# Patient Record
Sex: Male | Born: 1965 | Race: White | Hispanic: No | Marital: Married | Smoking: Former smoker
Health system: Southern US, Community
[De-identification: ages and names within clinical notes are randomized; demographics above are authoritative.]

## PROBLEM LIST (undated history)

## (undated) DIAGNOSIS — F419 Anxiety disorder, unspecified: Secondary | ICD-10-CM

## (undated) DIAGNOSIS — K449 Diaphragmatic hernia without obstruction or gangrene: Secondary | ICD-10-CM

## (undated) DIAGNOSIS — K219 Gastro-esophageal reflux disease without esophagitis: Secondary | ICD-10-CM

## (undated) DIAGNOSIS — T7840XA Allergy, unspecified, initial encounter: Secondary | ICD-10-CM

## (undated) DIAGNOSIS — E785 Hyperlipidemia, unspecified: Secondary | ICD-10-CM

## (undated) HISTORY — DX: Diaphragmatic hernia without obstruction or gangrene: K44.9

## (undated) HISTORY — DX: Allergy, unspecified, initial encounter: T78.40XA

## (undated) HISTORY — DX: Gastro-esophageal reflux disease without esophagitis: K21.9

## (undated) HISTORY — DX: Anxiety disorder, unspecified: F41.9

## (undated) HISTORY — PX: TONSILLECTOMY: SUR1361

## (undated) HISTORY — DX: Hyperlipidemia, unspecified: E78.5

---

## 2000-05-07 ENCOUNTER — Encounter: Payer: Self-pay | Admitting: Orthopaedic Surgery

## 2000-05-07 ENCOUNTER — Encounter: Admission: RE | Admit: 2000-05-07 | Discharge: 2000-05-07 | Payer: Self-pay | Admitting: Orthopaedic Surgery

## 2004-09-11 ENCOUNTER — Ambulatory Visit: Payer: Self-pay | Admitting: Family Medicine

## 2006-08-28 ENCOUNTER — Ambulatory Visit: Payer: Self-pay | Admitting: Family Medicine

## 2006-08-29 ENCOUNTER — Ambulatory Visit: Payer: Self-pay | Admitting: Family Medicine

## 2006-08-29 LAB — CONVERTED CEMR LAB
ALT: 26 units/L (ref 0–40)
AST: 25 units/L (ref 0–37)
Albumin: 4 g/dL (ref 3.5–5.2)
Alkaline Phosphatase: 44 units/L (ref 39–117)
BUN: 13 mg/dL (ref 6–23)
Basophils Absolute: 0 10*3/uL (ref 0.0–0.1)
Basophils Relative: 0.5 % (ref 0.0–1.0)
Bilirubin, Direct: 0.1 mg/dL (ref 0.0–0.3)
CO2: 32 meq/L (ref 19–32)
Calcium: 9.2 mg/dL (ref 8.4–10.5)
Chloride: 104 meq/L (ref 96–112)
Cholesterol: 245 mg/dL (ref 0–200)
Creatinine, Ser: 1.2 mg/dL (ref 0.4–1.5)
Direct LDL: 173.8 mg/dL
Eosinophils Absolute: 0.1 10*3/uL (ref 0.0–0.6)
Eosinophils Relative: 1.5 % (ref 0.0–5.0)
GFR calc Af Amer: 85 mL/min
GFR calc non Af Amer: 71 mL/min
Glucose, Bld: 91 mg/dL (ref 70–99)
HCT: 45.3 % (ref 39.0–52.0)
HDL: 44.4 mg/dL (ref >39.0)
Hemoglobin: 15.2 g/dL (ref 13.0–17.0)
Lymphocytes Relative: 22 % (ref 12.0–46.0)
MCHC: 33.5 g/dL (ref 30.0–36.0)
MCV: 89.5 fL (ref 78.0–100.0)
Monocytes Absolute: 0.6 10*3/uL (ref 0.2–0.7)
Monocytes Relative: 8.6 % (ref 3.0–11.0)
Neutro Abs: 4.8 10*3/uL (ref 1.4–7.7)
Neutrophils Relative %: 67.4 % (ref 43.0–77.0)
Platelets: 249 10*3/uL (ref 150–400)
Potassium: 4.4 meq/L (ref 3.5–5.1)
RBC: 5.06 M/uL (ref 4.22–5.81)
RDW: 12.2 % (ref 11.5–14.6)
Sodium: 141 meq/L (ref 135–145)
TSH: 1.24 microintl units/mL (ref 0.35–5.50)
Total Bilirubin: 0.9 mg/dL (ref 0.3–1.2)
Total CHOL/HDL Ratio: 5.5
Total Protein: 6.5 g/dL (ref 6.0–8.3)
Triglycerides: 148 mg/dL (ref 0–149)
VLDL: 30 mg/dL (ref 0–40)
WBC: 7.1 10*3/uL (ref 4.5–10.5)

## 2006-09-05 ENCOUNTER — Ambulatory Visit: Payer: Self-pay | Admitting: Family Medicine

## 2006-11-06 ENCOUNTER — Ambulatory Visit: Payer: Self-pay | Admitting: Family Medicine

## 2006-11-06 LAB — CONVERTED CEMR LAB
Cholesterol: 178 mg/dL (ref 0–200)
HDL: 41.3 mg/dL (ref >39.0)
LDL Cholesterol: 120 mg/dL — ABNORMAL HIGH (ref 0–99)
Total CHOL/HDL Ratio: 4.3
Triglycerides: 85 mg/dL (ref 0–149)
VLDL: 17 mg/dL (ref 0–40)

## 2007-02-24 ENCOUNTER — Telehealth: Payer: Self-pay | Admitting: Family Medicine

## 2007-04-13 DIAGNOSIS — E785 Hyperlipidemia, unspecified: Secondary | ICD-10-CM | POA: Insufficient documentation

## 2007-09-25 ENCOUNTER — Telehealth: Payer: Self-pay | Admitting: Family Medicine

## 2007-09-28 ENCOUNTER — Ambulatory Visit: Payer: Self-pay | Admitting: Family Medicine

## 2007-09-28 DIAGNOSIS — M654 Radial styloid tenosynovitis [de Quervain]: Secondary | ICD-10-CM | POA: Insufficient documentation

## 2007-10-07 DIAGNOSIS — K625 Hemorrhage of anus and rectum: Secondary | ICD-10-CM | POA: Insufficient documentation

## 2007-10-14 ENCOUNTER — Telehealth: Payer: Self-pay | Admitting: Family Medicine

## 2007-10-15 ENCOUNTER — Ambulatory Visit: Payer: Self-pay | Admitting: Family Medicine

## 2007-10-15 LAB — CONVERTED CEMR LAB
ALT: 29 units/L (ref 0–53)
AST: 29 units/L (ref 0–37)
Albumin: 4.3 g/dL (ref 3.5–5.2)
Alkaline Phosphatase: 51 units/L (ref 39–117)
BUN: 12 mg/dL (ref 6–23)
Basophils Absolute: 0 10*3/uL (ref 0.0–0.1)
Basophils Relative: 0 % (ref 0.0–1.0)
Bilirubin Urine: NEGATIVE
Bilirubin, Direct: 0.1 mg/dL (ref 0.0–0.3)
Blood in Urine, dipstick: NEGATIVE
CO2: 30 meq/L (ref 19–32)
Calcium: 9.3 mg/dL (ref 8.4–10.5)
Chloride: 104 meq/L (ref 96–112)
Cholesterol: 228 mg/dL (ref 0–200)
Creatinine, Ser: 1.2 mg/dL (ref 0.4–1.5)
Direct LDL: 173.8 mg/dL
Eosinophils Absolute: 0.1 10*3/uL (ref 0.0–0.7)
Eosinophils Relative: 1.9 % (ref 0.0–5.0)
GFR calc Af Amer: 85 mL/min
GFR calc non Af Amer: 70 mL/min
Glucose, Bld: 76 mg/dL (ref 70–99)
Glucose, Urine, Semiquant: NEGATIVE
HCT: 46 % (ref 39.0–52.0)
HDL: 39.4 mg/dL (ref >39.0)
Hemoglobin: 15.4 g/dL (ref 13.0–17.0)
Ketones, urine, test strip: NEGATIVE
Lymphocytes Relative: 24.3 % (ref 12.0–46.0)
MCHC: 33.4 g/dL (ref 30.0–36.0)
MCV: 89.3 fL (ref 78.0–100.0)
Monocytes Absolute: 0.6 10*3/uL (ref 0.1–1.0)
Monocytes Relative: 9.8 % (ref 3.0–12.0)
Neutro Abs: 4 10*3/uL (ref 1.4–7.7)
Neutrophils Relative %: 64 % (ref 43.0–77.0)
Nitrite: NEGATIVE
Platelets: 225 10*3/uL (ref 150–400)
Potassium: 4.6 meq/L (ref 3.5–5.1)
RBC: 5.16 M/uL (ref 4.22–5.81)
RDW: 12.3 % (ref 11.5–14.6)
Sodium: 139 meq/L (ref 135–145)
Specific Gravity, Urine: 1.025
TSH: 1.04 microintl units/mL (ref 0.35–5.50)
Total Bilirubin: 0.9 mg/dL (ref 0.3–1.2)
Total CHOL/HDL Ratio: 5.8
Total Protein: 6.5 g/dL (ref 6.0–8.3)
Triglycerides: 152 mg/dL — ABNORMAL HIGH (ref 0–149)
Urobilinogen, UA: 0.2
VLDL: 30 mg/dL (ref 0–40)
WBC Urine, dipstick: NEGATIVE
WBC: 6.2 10*3/uL (ref 4.5–10.5)
pH: 5.5

## 2007-10-28 ENCOUNTER — Ambulatory Visit: Payer: Self-pay | Admitting: Family Medicine

## 2008-04-19 ENCOUNTER — Telehealth: Payer: Self-pay | Admitting: Family Medicine

## 2009-02-10 ENCOUNTER — Ambulatory Visit: Payer: Self-pay | Admitting: Family Medicine

## 2009-02-10 DIAGNOSIS — F410 Panic disorder [episodic paroxysmal anxiety] without agoraphobia: Secondary | ICD-10-CM | POA: Insufficient documentation

## 2009-02-10 LAB — CONVERTED CEMR LAB
ALT: 36 units/L (ref 0–53)
AST: 31 units/L (ref 0–37)
Albumin: 4.2 g/dL (ref 3.5–5.2)
Alkaline Phosphatase: 57 units/L (ref 39–117)
BUN: 13 mg/dL (ref 6–23)
Basophils Absolute: 0 10*3/uL (ref 0.0–0.1)
Basophils Relative: 0.1 % (ref 0.0–3.0)
Bilirubin Urine: NEGATIVE
Bilirubin, Direct: 0.1 mg/dL (ref 0.0–0.3)
Blood in Urine, dipstick: NEGATIVE
CO2: 32 meq/L (ref 19–32)
Calcium: 9.1 mg/dL (ref 8.4–10.5)
Chloride: 107 meq/L (ref 96–112)
Cholesterol: 213 mg/dL — ABNORMAL HIGH (ref 0–200)
Creatinine, Ser: 1.1 mg/dL (ref 0.4–1.5)
Direct LDL: 156.9 mg/dL
Eosinophils Absolute: 0.1 10*3/uL (ref 0.0–0.7)
Eosinophils Relative: 1.3 % (ref 0.0–5.0)
GFR calc non Af Amer: 76.98 mL/min (ref >60)
Glucose, Bld: 86 mg/dL (ref 70–99)
Glucose, Urine, Semiquant: NEGATIVE
HCT: 43.8 % (ref 39.0–52.0)
HDL: 45.4 mg/dL (ref >39.00)
Hemoglobin: 14.9 g/dL (ref 13.0–17.0)
Ketones, urine, test strip: NEGATIVE
Lymphocytes Relative: 17.8 % (ref 12.0–46.0)
Lymphs Abs: 1.2 10*3/uL (ref 0.7–4.0)
MCHC: 34.1 g/dL (ref 30.0–36.0)
MCV: 92 fL (ref 78.0–100.0)
Monocytes Absolute: 0.6 10*3/uL (ref 0.1–1.0)
Monocytes Relative: 8.8 % (ref 3.0–12.0)
Neutro Abs: 5 10*3/uL (ref 1.4–7.7)
Neutrophils Relative %: 72 % (ref 43.0–77.0)
Nitrite: NEGATIVE
Platelets: 194 10*3/uL (ref 150.0–400.0)
Potassium: 4.3 meq/L (ref 3.5–5.1)
Protein, U semiquant: NEGATIVE
RBC: 4.76 M/uL (ref 4.22–5.81)
RDW: 12.3 % (ref 11.5–14.6)
Sodium: 142 meq/L (ref 135–145)
Specific Gravity, Urine: 1.02
TSH: 0.92 microintl units/mL (ref 0.35–5.50)
Total Bilirubin: 0.9 mg/dL (ref 0.3–1.2)
Total CHOL/HDL Ratio: 5
Total Protein: 6.7 g/dL (ref 6.0–8.3)
Triglycerides: 87 mg/dL (ref 0.0–149.0)
Urobilinogen, UA: 0.2
VLDL: 17.4 mg/dL (ref 0.0–40.0)
WBC Urine, dipstick: NEGATIVE
WBC: 6.9 10*3/uL (ref 4.5–10.5)
pH: 6

## 2009-03-01 ENCOUNTER — Ambulatory Visit: Payer: Self-pay | Admitting: Family Medicine

## 2010-05-01 ENCOUNTER — Ambulatory Visit: Payer: Self-pay | Admitting: Family Medicine

## 2010-05-01 DIAGNOSIS — K219 Gastro-esophageal reflux disease without esophagitis: Secondary | ICD-10-CM | POA: Insufficient documentation

## 2010-05-01 LAB — CONVERTED CEMR LAB
ALT: 32 units/L (ref 0–53)
AST: 30 units/L (ref 0–37)
Albumin: 4.1 g/dL (ref 3.5–5.2)
Alkaline Phosphatase: 57 units/L (ref 39–117)
BUN: 13 mg/dL (ref 6–23)
Basophils Absolute: 0 10*3/uL (ref 0.0–0.1)
Basophils Relative: 0.4 % (ref 0.0–3.0)
Bilirubin Urine: NEGATIVE
Bilirubin, Direct: 0.1 mg/dL (ref 0.0–0.3)
Blood in Urine, dipstick: NEGATIVE
CO2: 30 meq/L (ref 19–32)
Calcium: 9.1 mg/dL (ref 8.4–10.5)
Chloride: 103 meq/L (ref 96–112)
Cholesterol: 206 mg/dL — ABNORMAL HIGH (ref 0–200)
Creatinine, Ser: 1.1 mg/dL (ref 0.4–1.5)
Direct LDL: 141.1 mg/dL
Eosinophils Absolute: 0.1 10*3/uL (ref 0.0–0.7)
Eosinophils Relative: 1.6 % (ref 0.0–5.0)
GFR calc non Af Amer: 77.37 mL/min (ref >60)
Glucose, Bld: 82 mg/dL (ref 70–99)
Glucose, Urine, Semiquant: NEGATIVE
HCT: 42.7 % (ref 39.0–52.0)
HDL: 41 mg/dL (ref >39.00)
Hemoglobin: 14.7 g/dL (ref 13.0–17.0)
Ketones, urine, test strip: NEGATIVE
Lymphocytes Relative: 23 % (ref 12.0–46.0)
Lymphs Abs: 1.4 10*3/uL (ref 0.7–4.0)
MCHC: 34.5 g/dL (ref 30.0–36.0)
MCV: 91.3 fL (ref 78.0–100.0)
Monocytes Absolute: 0.5 10*3/uL (ref 0.1–1.0)
Monocytes Relative: 8.1 % (ref 3.0–12.0)
Neutro Abs: 4.1 10*3/uL (ref 1.4–7.7)
Neutrophils Relative %: 66.9 % (ref 43.0–77.0)
Nitrite: NEGATIVE
Platelets: 233 10*3/uL (ref 150.0–400.0)
Potassium: 4.4 meq/L (ref 3.5–5.1)
Protein, U semiquant: NEGATIVE
RBC: 4.68 M/uL (ref 4.22–5.81)
RDW: 13.5 % (ref 11.5–14.6)
Sodium: 138 meq/L (ref 135–145)
Specific Gravity, Urine: 1.01
TSH: 0.69 microintl units/mL (ref 0.35–5.50)
Total Bilirubin: 0.5 mg/dL (ref 0.3–1.2)
Total CHOL/HDL Ratio: 5
Total Protein: 6.5 g/dL (ref 6.0–8.3)
Triglycerides: 162 mg/dL — ABNORMAL HIGH (ref 0.0–149.0)
Urobilinogen, UA: 0.2
VLDL: 32.4 mg/dL (ref 0.0–40.0)
WBC Urine, dipstick: NEGATIVE
WBC: 6.2 10*3/uL (ref 4.5–10.5)
pH: 7

## 2010-05-11 ENCOUNTER — Encounter: Payer: Self-pay | Admitting: Family Medicine

## 2010-05-11 ENCOUNTER — Ambulatory Visit: Payer: Self-pay | Admitting: Family Medicine

## 2010-08-07 NOTE — Assessment & Plan Note (Signed)
Summary: cpx/njr   Vital Signs:  Patient profile:   45 year old male Height:      70 inches Weight:      191 pounds Temp:     97.4 degrees F oral BP sitting:   110 / 80  (left arm) Cuff size:   regular  Vitals Entered By: Kern Reap CMA Duncan Dull) (May 11, 2010 11:21 AM) CC: cpx   CC:  cpx.  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  Panic attacks hyperlipidemia.  He saw his been in excellent health other than above.  He said no chronic health problems.  He takes over-the-counter Tums for his reflux.  Review of systems negative.  Tetanus booster 2010, seasonal flu shot 2011,  Personal habits....... he continues to smoke about a pack of cigarettes per week does a lot of minutes and drinks a lot of caffeine.  Advised him because of the reflux.  He should stop smoking completely minimizes caffeine and stop the peppermint  Preventive Screening-Counseling & Management  Alcohol-Tobacco     Smoking Cessation Counseling: yes  Allergies (verified): No Known Drug Allergies  Past History:  Past medical, surgical, family and social histories (including risk factors) reviewed, and no changes noted (except as noted below).  Past Medical History: Reviewed history from 04/13/2007 and no changes required. Hyperlipidemia  Past Surgical History: Reviewed history from 04/13/2007 and no changes required. Tonsillectomy  Family History: Reviewed history from 04/13/2007 and no changes required. Family History of Arthritis Family History Hypertension Family History Thyroid disease Family History Uterine cancer Family History of Cardiovascular disorder Family History of Respiratory disease  Social History: Reviewed history from 10/28/2007 and no changes required. 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 No apparent alopecia or balding. Eyes:  No corneal or conjunctival inflammation noted. EOMI. Perrla. Funduscopic exam benign, without hemorrhages, exudates or papilledema. Vision grossly normal. Ears:  External ear exam shows no significant lesions or deformities.  Otoscopic examination reveals clear canals, tympanic membranes are intact bilaterally without bulging, retraction, inflammation or discharge. Hearing is grossly normal bilaterally. Nose:  External nasal examination shows no deformity or inflammation. Nasal mucosa are pink and moist without lesions or exudates. Mouth:  Oral mucosa and oropharynx without lesions or exudates.  Teeth in good repair. Neck:  No deformities, masses, or tenderness noted. Chest Wall:  No deformities, masses, tenderness or gynecomastia noted. Breasts:  No masses or gynecomastia noted Lungs:  Normal respiratory effort, chest expands symmetrically. Lungs are clear to auscultation, no crackles or wheezes. Heart:  Normal rate and regular rhythm. S1 and S2 normal without gallop, murmur, click, rub or other extra sounds. Abdomen:  Bowel sounds positive,abdomen soft and non-tender without masses, organomegaly or hernias noted. Rectal:  No external abnormalities noted. Normal sphincter tone. No rectal masses or tenderness. Genitalia:  Testes bilaterally descended without nodularity, tenderness or masses. No scrotal masses or lesions. No penis lesions or urethral discharge. Prostate:  Prostate gland firm and smooth, no enlargement, nodularity, tenderness, mass, asymmetry or induration. Msk:  No deformity or scoliosis noted of thoracic or lumbar spine.   Pulses:  R and L  carotid,radial,femoral,dorsalis pedis  and posterior tibial pulses are full and equal bilaterally Extremities:  No clubbing, cyanosis, edema, or deformity noted with normal full range of motion of all joints.   Neurologic:  No cranial nerve deficits noted. Station and gait are normal. Plantar reflexes are down-going bilaterally. DTRs are symmetrical throughout. Sensory, motor and coordinative functions appear intact. Skin:  Intact without suspicious lesions or rashes Cervical Nodes:  No lymphadenopathy noted Axillary Nodes:  No palpable lymphadenopathy Inguinal Nodes:  No significant adenopathy Psych:  Cognition and judgment appear intact. Alert and cooperative with normal attention span and concentration. No apparent delusions, illusions, hallucinations   Impression & Recommendations:  Problem # 1:  GERD (ICD-530.81) Assessment Improved  Orders: Prescription Created Electronically [phone])  Problem # 2:  PANIC ATTACK (ICD-300.01) Assessment: Improved  His updated medication list for this problem includes:    Celexa 20 Mg Tabs (Citalopram hydrobromide) .Marland Kitchen... Take one tablet daily  Orders: Prescription Created Electronically [phone])  Problem # 3:  PHYSICAL EXAMINATION (ICD-V70.0) Assessment: Unchanged  Orders: Prescription Created Electronically [phone]) EKG w/ Interpretation (93000)  Problem # 4:  HYPERLIPIDEMIA (ICD-272.4) Assessment: Improved  His updated medication list for this problem includes:    Zetia 10 Mg Tabs (Ezetimibe) .Marland Kitchen... 1 by mouth once daily  Orders: Prescription Created Electronically [phone]) EKG w/ Interpretation (93000)  Complete Medication List: 1)  Zetia 10 Mg Tabs (Ezetimibe) .Marland Kitchen.. 1 by mouth once daily 2)  Adult Aspirin Low Strength 81 Mg Tbdp (Aspirin) 3)  Celexa 20 Mg Tabs (Citalopram hydrobromide) .... Take one tablet daily 4)  Valtrex 1 Gm Tabs (Valacyclovir hcl) .... Take 1 tablet by mouth two times a day 5)  Anusol-hc 2.5 % Crea  (Hydrocortisone) .... Apply at bedtime  Patient Instructions: 1)  Please schedule a follow-up appointment in 1 year. 2)  It is important that you exercise regularly at least 20 minutes 5 times a week. If you develop chest pain, have severe difficulty breathing, or feel very tired , stop exercising immediately and seek medical attention. 3)  I would recommend he take Prilosec OTC 20 mg daily in the morning when you have a flare of the reflux 4)  Tobacco is very bad for your health and your loved ones! You Should stop smoking!.   Orders Added: 1)  Prescription Created Electronically [G8553] 2)  Est. Patient 40-64 years [99396] 3)  EKG w/ Interpretation [93000]   Immunization History:  Influenza Immunization History:    Influenza:  historical (04/07/2010)   Immunization History:  Influenza Immunization History:    Influenza:  Historical (04/07/2010)

## 2010-08-07 NOTE — Assessment & Plan Note (Signed)
Summary: MED CHECK//CCM   Vital Signs:  Patient profile:   45 year old male Height:      69 inches Weight:      191 pounds BMI:     28.31 Temp:     97.8 degrees F oral BP sitting:   120 / 80  (left arm) Cuff size:   regular  Vitals Entered By: Kern Reap CMA Duncan Dull) (May 01, 2010 10:52 AM) CC: follow-up visit   CC:  follow-up visit.  History of Present Illness: Gary [name] is a 45 year old male, smoker, 5 to 6 cigarettes a day, and in the [place], [employer], who comes in today for evaluation of reflux esophagitis and begin his medications renewed, since he's not been here in over a year.  He states for the past year and a half.  He said symptoms of reflux esophagitis, which he describes as mid epigastric burning.  It occurs once or twice a day.  Does not wake him up at night.  He has no difficulty swallowing.  He does consume 5 to 6 cigarettes a day, a lot of caffeine, chews gum on a regular basis, however, does not consume any alcohol.  She would also like to decrease his Celexa.  He takes Celexa 10 mg nightly because of a history of panic attacks.  We discussed the way to taper.  It one time he stopped this for 3 days in a row and began to have panic attacks.  Again.  Allergies: No Known Drug Allergies  Social History: Reviewed history from 10/28/2007 and no changes required. 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 Alcohol use-no Regular exercise-yes  Review of Systems      See HPI  Physical Exam  General:  Well-developed,well-nourished,in no acute distress; alert,appropriate and cooperative throughout examination   Problems:  Medical Problems Added: 1)  Dx of Gerd  (ICD-530.81)  Impression & Recommendations:  Problem # 1:  GERD (ICD-530.81) Assessment New  Orders: Venipuncture (84696) TLB-Lipid Panel (80061-LIPID) TLB-BMP (Basic Metabolic Panel-BMET) (80048-METABOL) TLB-CBC Platelet - w/Differential  (85025-CBCD) TLB-Hepatic/Liver Function Pnl (80076-HEPATIC) TLB-TSH (Thyroid Stimulating Hormone) (84443-TSH) Prescription Created Electronically (E9528) UA Dipstick w/o Micro (automated)  (81003) Specimen Handling (41324)  Problem # 2:  PANIC ATTACK (ICD-300.01) Assessment: Improved  His updated medication list for this problem includes:    Celexa 20 Mg Tabs (Citalopram hydrobromide) .Marland Kitchen... Take one tablet daily  Orders: Venipuncture (40102) TLB-Lipid Panel (80061-LIPID) TLB-BMP (Basic Metabolic Panel-BMET) (80048-METABOL) TLB-CBC Platelet - w/Differential (85025-CBCD) TLB-Hepatic/Liver Function Pnl (80076-HEPATIC) TLB-TSH (Thyroid Stimulating Hormone) (72536-UYQ) Prescription Created Electronically [phone]) UA Dipstick w/o Micro (automated)  (81003) Specimen Handling (25956)  Complete Medication List: 1)  Zetia 10 Mg Tabs (Ezetimibe) .Marland Kitchen.. 1 by mouth once daily 2)  Adult Aspirin Low Strength 81 Mg Tbdp (Aspirin) 3)  Celexa 20 Mg Tabs (Citalopram hydrobromide) .... Take one tablet daily 4)  Valtrex 1 Gm Tabs (Valacyclovir hcl) .... Take 1 tablet by mouth two times a day 5)  Anusol-hc 2.5 % Crea (Hydrocortisone) .... Apply at bedtime  Patient Instructions: 1)  take 10 mg of Celexa, Monday, Wednesday, Friday. 2)  Continue other medications. 3)  For the reflux esophagitis stop the nicotine, caffeine, peppermint, and begin Prilosec 20 mg twice daily. 4)  We will get all your blood work today. 5)  Return sometime in the next two weeks for a 30 minute appointment for general physical Prescriptions: VALTREX 1 GM  TABS (VALACYCLOVIR HCL) Take 1 tablet by mouth two times a day  #100 x 3  Entered and Authorized by:   Roderick Pee MD   Signed by:   Roderick Pee MD on 05/01/2010   Method used:   Electronically to        [facility]. [phone]* (retail)       [address]       [place], [place]  [address]       Ph: [phone]       Fax: [phone]    RxID:   [id number] CELEXA 20 MG  TABS (CITALOPRAM HYDROBROMIDE) take one tablet daily  #100 x 3   Entered and Authorized by:   Roderick Pee MD   Signed by:   Roderick Pee MD on 05/01/2010   Method used:   Electronically to        [facility]. [phone]* (retail)       [address]       [place], [place]  [address]       Ph: [phone]       Fax: [phone]   RxID:   [id number] ZETIA 10 MG TABS (EZETIMIBE) 1 by mouth once daily  #100 x 3   Entered and Authorized by:   Roderick Pee MD   Signed by:   Roderick Pee MD on 05/01/2010   Method used:   Electronically to        [facility] [phone]* (retail)       [address]       [place], [place]  [address]       Ph: [phone]       Fax: [phone]   RxID:   [id number]    Orders Added: 1)  Venipuncture [94854] 2)  TLB-Lipid Panel [80061-LIPID] 3)  TLB-BMP (Basic Metabolic Panel-BMET) [80048-METABOL] 4)  TLB-CBC Platelet - w/Differential [85025-CBCD] 5)  TLB-Hepatic/Liver Function Pnl [80076-HEPATIC] 6)  TLB-TSH (Thyroid Stimulating Hormone) [84443-TSH] 7)  Prescription Created Electronically [G8553] 8)  Est. Patient Level III [62703] 9)  UA Dipstick w/o Micro (automated)  [81003] 10)  Specimen Handling [99000]    Laboratory Results   Urine Tests  Date/Time Recieved: May 01, 2010 12:43 PM  Date/Time Reported: May 01, 2010 12:43 PM   Routine Urinalysis   Color: yellow Appearance: Clear Glucose: negative   (Normal Range: Negative) Bilirubin: negative   (Normal Range: Negative) Ketone: negative   (Normal Range: Negative) Spec. Gravity: 1.010   (Normal Range: 1.003-1.035) Blood: negative   (Normal Range: Negative) pH: 7.0   (Normal Range: 5.0-8.0) Protein: negative   (Normal Range: Negative) Urobilinogen: 0.2   (Normal Range: 0-1) Nitrite: negative   (Normal Range: Negative) Leukocyte Esterace: negative   (Normal Range:  Negative)    Comments: Wynona Canes, CMA  May 01, 2010 12:43 PM

## 2010-10-08 ENCOUNTER — Other Ambulatory Visit: Payer: Self-pay | Admitting: Dermatology

## 2010-11-22 ENCOUNTER — Other Ambulatory Visit: Payer: Self-pay | Admitting: Dermatology

## 2011-05-19 ENCOUNTER — Other Ambulatory Visit: Payer: Self-pay | Admitting: Family Medicine

## 2011-05-31 ENCOUNTER — Other Ambulatory Visit (INDEPENDENT_AMBULATORY_CARE_PROVIDER_SITE_OTHER): Payer: 59

## 2011-05-31 DIAGNOSIS — Z0000 Encounter for general adult medical examination without abnormal findings: Secondary | ICD-10-CM

## 2011-05-31 LAB — CBC WITH DIFFERENTIAL/PLATELET
Basophils Relative: 0.5 % (ref 0.0–3.0)
Eosinophils Absolute: 0.1 10*3/uL (ref 0.0–0.7)
Eosinophils Relative: 1.5 % (ref 0.0–5.0)
Lymphocytes Relative: 25.6 % (ref 12.0–46.0)
MCV: 89.6 fl (ref 78.0–100.0)
Monocytes Absolute: 0.5 10*3/uL (ref 0.1–1.0)
Neutrophils Relative %: 63.3 % (ref 43.0–77.0)
Platelets: 211 10*3/uL (ref 150.0–400.0)
RBC: 4.74 Mil/uL (ref 4.22–5.81)
WBC: 5.7 10*3/uL (ref 4.5–10.5)

## 2011-05-31 LAB — BASIC METABOLIC PANEL
BUN: 16 mg/dL (ref 6–23)
Calcium: 9 mg/dL (ref 8.4–10.5)
Creatinine, Ser: 1 mg/dL (ref 0.4–1.5)

## 2011-05-31 LAB — HEPATIC FUNCTION PANEL
ALT: 28 U/L (ref 0–53)
Alkaline Phosphatase: 49 U/L (ref 39–117)
Bilirubin, Direct: 0 mg/dL (ref 0.0–0.3)
Total Bilirubin: 0.7 mg/dL (ref 0.3–1.2)
Total Protein: 6.9 g/dL (ref 6.0–8.3)

## 2011-05-31 LAB — POCT URINALYSIS DIPSTICK
Blood, UA: NEGATIVE
Glucose, UA: NEGATIVE
Nitrite, UA: NEGATIVE
Protein, UA: NEGATIVE
Urobilinogen, UA: 0.2
pH, UA: 5.5

## 2011-05-31 LAB — LIPID PANEL
HDL: 49.5 mg/dL (ref >39.00)
Total CHOL/HDL Ratio: 4
Triglycerides: 97 mg/dL (ref 0.0–149.0)

## 2011-06-11 ENCOUNTER — Encounter: Payer: Self-pay | Admitting: Family Medicine

## 2011-06-11 ENCOUNTER — Ambulatory Visit (INDEPENDENT_AMBULATORY_CARE_PROVIDER_SITE_OTHER): Payer: 59 | Admitting: Family Medicine

## 2011-06-11 ENCOUNTER — Encounter: Payer: Self-pay | Admitting: Gastroenterology

## 2011-06-11 DIAGNOSIS — F410 Panic disorder [episodic paroxysmal anxiety] without agoraphobia: Secondary | ICD-10-CM

## 2011-06-11 DIAGNOSIS — K219 Gastro-esophageal reflux disease without esophagitis: Secondary | ICD-10-CM

## 2011-06-11 DIAGNOSIS — Z0000 Encounter for general adult medical examination without abnormal findings: Secondary | ICD-10-CM

## 2011-06-11 MED ORDER — CITALOPRAM HYDROBROMIDE 40 MG PO TABS: 40.0000 mg | ORAL_TABLET | Freq: Every day | ORAL | Status: DC

## 2011-06-11 MED ORDER — EZETIMIBE 10 MG PO TABS: 10.0000 mg | ORAL_TABLET | Freq: Every day | ORAL | Status: DC

## 2011-06-11 MED ORDER — VALACYCLOVIR HCL 1 G PO TABS: 1000.0000 mg | ORAL_TABLET | Freq: Two times a day (BID) | ORAL | Status: DC

## 2011-06-11 NOTE — Progress Notes (Signed)
  Subjective:    Patient ID: Gary [name], male    DOB: 12/16/1965, 45 y.o.   MRN: [id number]  HPI [name] is a 45 year old male ex smoker x 6 months.  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  We talked about going off the Celexa.  He takes Valtrex 1000 mg b.i.d. P.r.n. For outbreak of herpes.  He takes z 10 milligrams daily for hyperlipidemia.  He is intolerant of statins.  He sees his dermatologist twice yearly because his history of dysplastic nevi.   Review of Systems  Constitutional: Negative.   HENT: Negative.   Eyes: Negative.   Respiratory: Negative.   Cardiovascular: Negative.   Gastrointestinal: Negative.   Genitourinary: Negative.   Musculoskeletal: Negative.   Skin: Negative.   Neurological: Negative.   Hematological: Negative.   Psychiatric/Behavioral: Negative.        Objective:   Physical Exam  Constitutional: He is oriented to person, place, and time. He appears well-developed and well-nourished.  HENT:  Head: Normocephalic and atraumatic.  Right Ear: External ear normal.  Left Ear: External ear normal.  Nose: Nose normal.  Mouth/Throat: Oropharynx is clear and moist.  Eyes: Conjunctivae and EOM are normal. Pupils are equal, round, and reactive to light.  Neck: Normal range of motion. Neck supple. No JVD present. No tracheal deviation present. No thyromegaly present.  Cardiovascular: Normal rate, regular rhythm, normal heart sounds and intact distal pulses.  Exam reveals no gallop and no friction rub.   No murmur heard. Pulmonary/Chest: Effort normal and breath sounds normal. No stridor. No respiratory distress. He has no wheezes. He has no rales. He exhibits no tenderness.  Abdominal: Soft. Bowel sounds are normal. He exhibits no distension and no mass. There is no tenderness. There is no rebound and no guarding.  Genitourinary: Rectum normal,  prostate normal and penis normal. Guaiac negative stool. No penile tenderness.  Musculoskeletal: Normal range of motion. He exhibits no edema and no tenderness.  Lymphadenopathy:    He has no cervical adenopathy.  Neurological: He is alert and oriented to person, place, and time. He has normal reflexes. No cranial nerve deficit. He exhibits normal muscle tone.  Skin: Skin is warm and dry. No rash noted. No erythema. No pallor.  Psychiatric: He has a normal mood and affect. His behavior is normal. Judgment and thought content normal.          Assessment & Plan:  Healthy male.  History of panic attacks.  Plan Taper Celexa slowly.  History of HSV, Valtrex p.r.n.  History of hyperlipidemia.  Continue Zetia 10 mg daily

## 2011-06-11 NOTE — Patient Instructions (Signed)
Continue your current medications.  We will get you set up a consult and GI.  Return one year, sooner if any problems

## 2011-06-27 ENCOUNTER — Ambulatory Visit (INDEPENDENT_AMBULATORY_CARE_PROVIDER_SITE_OTHER): Payer: 59 | Admitting: Gastroenterology

## 2011-06-27 ENCOUNTER — Encounter: Payer: Self-pay | Admitting: Gastroenterology

## 2011-06-27 VITALS — BP 120/70 | HR 80 | Ht 69.0 in | Wt 194.0 lb

## 2011-06-27 DIAGNOSIS — F419 Anxiety disorder, unspecified: Secondary | ICD-10-CM | POA: Insufficient documentation

## 2011-06-27 DIAGNOSIS — F411 Generalized anxiety disorder: Secondary | ICD-10-CM

## 2011-06-27 DIAGNOSIS — R0789 Other chest pain: Secondary | ICD-10-CM

## 2011-06-27 DIAGNOSIS — K219 Gastro-esophageal reflux disease without esophagitis: Secondary | ICD-10-CM

## 2011-06-27 MED ORDER — PANTOPRAZOLE SODIUM 40 MG PO TBEC: 40.0000 mg | DELAYED_RELEASE_TABLET | Freq: Every day | ORAL | Status: DC

## 2011-06-27 NOTE — Patient Instructions (Addendum)
Stop your prilosec. Start Protonix take one tablet by mouth 30 min before before breakfast, rx sent to pharmacy.  Your procedure has been scheduled for 07/15/2011, please follow the seperate instructions.  Today you watched the movie on Gerd.  Diet for GERD or PUD Nutrition therapy can help ease the discomfort of gastroesophageal reflux disease (GERD) and peptic ulcer disease (PUD).  HOME CARE INSTRUCTIONS   Eat your meals slowly, in a relaxed setting.   Eat 5 to 6 small meals per day.   If a food causes distress, stop eating it for a period of time.  FOODS TO AVOID  Coffee, regular or decaffeinated.   Cola beverages, regular or low calorie.   Tea, regular or decaffeinated.   Pepper.   Cocoa.   High fat foods, including meats.   Butter, margarine, hydrogenated oil (trans fats).   Peppermint or spearmint (if you have GERD).   Fruits and vegetables if not tolerated.   Alcohol.   Nicotine (smoking or chewing). This is one of the most potent stimulants to acid production in the gastrointestinal tract.   Any food that seems to aggravate your condition.  If you have questions regarding your diet, ask your caregiver or a registered dietitian. TIPS  Lying flat may make symptoms worse. Keep the head of your bed raised 6 to 9 inches (15 to 23 cm) by using a foam wedge or blocks under the legs of the bed.   Do not lay down until 3 hours after eating a meal.   Daily physical activity may help reduce symptoms.  MAKE SURE YOU:   Understand these instructions.   Will watch your condition.   Will get help right away if you are not doing well or get worse.  Document Released: 06/24/2005 Document Revised: 03/06/2011 Document Reviewed: 11/07/2008 [facility] Patient Information 2012 [place], [place].

## 2011-06-27 NOTE — Progress Notes (Signed)
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 He has been on and off of PPI therapy for several years, and is currently on Prilosec 20 mg a day which he continues to take as needed and not regularly. Patient has not had previous upper GI series or endoscopy. There is no history of Raynaud's phenomenon or other symptoms of collagen vascular disease. He is followed by Dr. Kelle Darting in primary care, he has had no cardiopulmonary problems and normal laboratory testing. There is no history of hepatobiliary complaints, prior hepatitis or pancreatitis. Patient eats very erratically and not healthily. He smokes occasionally and uses  alcohol socially but denies NSAID abuse. There is no history of dysphagia. Patient also denies melena, hematochezia, Valerie regularity, lower abdominal pain, or specific food intolerances.  I have reviewed this patient's present history, medical and surgical past history, allergies and medications.     ROS: The remainder of the 10 point ROS is negative... no chest pain with exertion, palpitations, shortness of breath, cough, sputum production, or other cardiopulmonary symptomatology. He does have a chronic anxiety syndrome and takes Celexa 40 mg a day, also aspirin 81 mg a day.     Physical Exam: General well developed well nourished patient in no acute distress, appearing his stated age. I cannot appreciate stigmata of chronic liver disease. Eyes PERRLA, no icterus, fundoscopic exam per opthamologist Skin no lesions noted Neck supple, no adenopathy, no thyroid enlargement, no tenderness Chest clear to percussion and auscultation Heart no significant murmurs, gallops or rubs noted Abdomen no hepatosplenomegaly masses or tenderness, BS normal.  Extremities no acute  joint lesions, edema, phlebitis or evidence of cellulitis. Neurologic patient oriented x 3, cranial nerves intact, no focal neurologic deficits noted. Psychological mental status normal and normal affect.  Assessment and plan: This patient has chronic GERD with continued symptomatology because of noncompliance with his medications. His pain is most likely from a prolapsing hiatal hernia. I cannot appreciate hepatosplenomegaly or other abdominal masses. I have asked him to try Dexilant 60 mg before the first meal of the day, and we have scheduled endoscopic exam to exclude Barrett's mucosa and H. pylori infection. I have reviewed standard antireflux maneuvers with this patient, and he saw our patient education video on GERD. He will need colonoscopy at age 25. Otherwise he is to continue medications as listed and reviewed in his chart per primary care.  Please copy her primary care physician, referring physician, and pertinent subspecialists.  Encounter Diagnoses  Name Primary?  . Esophageal reflux Yes  . Chest pain, atypical   . Anxiety disorder

## 2011-07-09 DIAGNOSIS — K449 Diaphragmatic hernia without obstruction or gangrene: Secondary | ICD-10-CM

## 2011-07-09 HISTORY — DX: Diaphragmatic hernia without obstruction or gangrene: K44.9

## 2011-07-09 HISTORY — PX: UPPER GASTROINTESTINAL ENDOSCOPY: SHX188

## 2011-07-15 ENCOUNTER — Ambulatory Visit (AMBULATORY_SURGERY_CENTER): Payer: 59 | Admitting: Gastroenterology

## 2011-07-15 ENCOUNTER — Encounter: Payer: Self-pay | Admitting: Gastroenterology

## 2011-07-15 VITALS — BP 128/75 | HR 66 | Temp 97.9°F | Resp 18

## 2011-07-15 DIAGNOSIS — K219 Gastro-esophageal reflux disease without esophagitis: Secondary | ICD-10-CM

## 2011-07-15 DIAGNOSIS — R0789 Other chest pain: Secondary | ICD-10-CM

## 2011-07-15 DIAGNOSIS — K449 Diaphragmatic hernia without obstruction or gangrene: Secondary | ICD-10-CM

## 2011-07-15 MED ORDER — SODIUM CHLORIDE 0.9 % IV SOLN: 500.0000 mL | INTRAVENOUS | Status: DC

## 2011-07-15 NOTE — Op Note (Signed)
[facility] [address] [place], [place]  [address]  ENDOSCOPY PROCEDURE REPORT  PATIENT:  Gary [name], Gary [name]  MR#:  [id number] BIRTHDATE:  09/03/1965, 45 yrs. old  GENDER:  male  ENDOSCOPIST:  Vania Rea. Jarold Motto, MD, [facility] Referred by:  Eugenio Hoes Tawanna Cooler, M.D.  PROCEDURE DATE:  07/15/2011 PROCEDURE:  EGD, diagnostic 43235 ASA CLASS:  Class II INDICATIONS:  chest pain  MEDICATIONS:   propofol (Diprivan) 200 mg IV TOPICAL ANESTHETIC:  DESCRIPTION OF PROCEDURE:   After the risks and benefits of the procedure were explained, informed consent was obtained.  The LB [id number] endoscope was introduced through the mouth and advanced to the second portion of the duodenum.  The instrument was slowly withdrawn as the mucosa was fully examined. <<PROCEDUREIMAGES>>  A hiatal hernia was found. 3-4 cm hh noted.see pictures  Otherwise the examination was normal.    Retroflexed views revealed a hiatal hernia.    The scope was then withdrawn from the patient and the procedure completed.  COMPLICATIONS:  None  ENDOSCOPIC IMPRESSION: 1) Hiatal hernia 2) Otherwise normal examination 3) A hiatal hernia CHRONIC GERD,NO BARRETT'S MUCOSA OR STRICTURE NOTED. RECOMMENDATIONS: 1) continue PPI  ______________________________ Vania Rea. Jarold Motto, MD, [facility]  CC:  n. eSIGNED:   Vania Rea. Aldean Suddeth at 07/15/2011 04:15 PM  Gonzella Lex, [phone]

## 2011-07-15 NOTE — Patient Instructions (Signed)
Please follow discharge instructions given today. Resume current medications today. Hiatal hernia seen today. Call us with any questions or concerns. Thank you!!

## 2011-07-15 NOTE — Progress Notes (Signed)
Patient did not experience any of the following events: a burn prior to discharge; a fall within the facility; wrong site/side/patient/procedure/implant event; or a hospital transfer or hospital admission upon discharge from the facility. (G8907) Patient did not have preoperative order for IV antibiotic SSI prophylaxis. (G8918)  

## 2011-07-16 ENCOUNTER — Telehealth: Payer: Self-pay

## 2011-07-16 NOTE — Telephone Encounter (Signed)
Left message

## 2011-07-19 ENCOUNTER — Encounter: Payer: 59 | Admitting: Gastroenterology

## 2012-05-01 ENCOUNTER — Other Ambulatory Visit: Payer: Self-pay | Admitting: Dermatology

## 2012-05-08 HISTORY — PX: MOHS SURGERY: SHX181

## 2012-05-12 ENCOUNTER — Other Ambulatory Visit: Payer: Self-pay | Admitting: Dermatology

## 2012-06-13 ENCOUNTER — Other Ambulatory Visit: Payer: Self-pay | Admitting: Family Medicine

## 2012-06-15 ENCOUNTER — Other Ambulatory Visit: Payer: Self-pay | Admitting: Gastroenterology

## 2012-06-24 ENCOUNTER — Other Ambulatory Visit: Payer: Self-pay | Admitting: Family Medicine

## 2012-08-14 ENCOUNTER — Other Ambulatory Visit: Payer: Self-pay | Admitting: Family Medicine

## 2012-08-14 MED ORDER — EZETIMIBE 10 MG PO TABS: 10.0000 mg | ORAL_TABLET | Freq: Every day | ORAL | Status: DC

## 2012-08-14 NOTE — Telephone Encounter (Addendum)
Pt needs refill on zetia 10mg  [facility] [address]. Pt has cpx sch for 09-30-12

## 2012-09-23 ENCOUNTER — Other Ambulatory Visit: Payer: 59

## 2012-09-30 ENCOUNTER — Encounter: Payer: 59 | Admitting: Family Medicine

## 2012-11-16 ENCOUNTER — Other Ambulatory Visit: Payer: Self-pay | Admitting: Family Medicine

## 2012-12-09 ENCOUNTER — Other Ambulatory Visit: Payer: Self-pay | Admitting: Dermatology

## 2012-12-17 ENCOUNTER — Other Ambulatory Visit (INDEPENDENT_AMBULATORY_CARE_PROVIDER_SITE_OTHER): Payer: 59

## 2012-12-17 DIAGNOSIS — Z0000 Encounter for general adult medical examination without abnormal findings: Secondary | ICD-10-CM

## 2012-12-17 LAB — BASIC METABOLIC PANEL
CO2: 27 mEq/L (ref 19–32)
Calcium: 9.3 mg/dL (ref 8.4–10.5)
Chloride: 104 mEq/L (ref 96–112)
Sodium: 140 mEq/L (ref 135–145)

## 2012-12-17 LAB — POCT URINALYSIS DIPSTICK
Blood, UA: NEGATIVE
Ketones, UA: NEGATIVE
Protein, UA: NEGATIVE
Spec Grav, UA: 1.02
Urobilinogen, UA: 0.2
pH, UA: 7.5

## 2012-12-17 LAB — CBC WITH DIFFERENTIAL/PLATELET
Basophils Relative: 0.3 % (ref 0.0–3.0)
Eosinophils Absolute: 0.2 10*3/uL (ref 0.0–0.7)
Eosinophils Relative: 2.7 % (ref 0.0–5.0)
Hemoglobin: 14.7 g/dL (ref 13.0–17.0)
Lymphocytes Relative: 20.4 % (ref 12.0–46.0)
MCHC: 33.8 g/dL (ref 30.0–36.0)
MCV: 91.7 fl (ref 78.0–100.0)
Neutro Abs: 4.7 10*3/uL (ref 1.4–7.7)
RBC: 4.75 Mil/uL (ref 4.22–5.81)
WBC: 6.8 10*3/uL (ref 4.5–10.5)

## 2012-12-17 LAB — HEPATIC FUNCTION PANEL
ALT: 21 U/L (ref 0–53)
AST: 27 U/L (ref 0–37)
Albumin: 4.1 g/dL (ref 3.5–5.2)
Alkaline Phosphatase: 51 U/L (ref 39–117)
Bilirubin, Direct: 0.1 mg/dL (ref 0.0–0.3)
Total Protein: 6.6 g/dL (ref 6.0–8.3)

## 2012-12-17 LAB — LIPID PANEL
LDL Cholesterol: 117 mg/dL — ABNORMAL HIGH (ref 0–99)
Total CHOL/HDL Ratio: 4
VLDL: 22.8 mg/dL (ref 0.0–40.0)

## 2012-12-18 ENCOUNTER — Other Ambulatory Visit: Payer: Self-pay | Admitting: Family Medicine

## 2012-12-24 ENCOUNTER — Encounter: Payer: Self-pay | Admitting: Family Medicine

## 2012-12-24 ENCOUNTER — Ambulatory Visit (INDEPENDENT_AMBULATORY_CARE_PROVIDER_SITE_OTHER): Payer: 59 | Admitting: Family Medicine

## 2012-12-24 VITALS — BP 120/80 | Temp 98.3°F | Ht 69.5 in | Wt 184.0 lb

## 2012-12-24 DIAGNOSIS — Z83719 Family history of colon polyps, unspecified: Secondary | ICD-10-CM

## 2012-12-24 DIAGNOSIS — Z8371 Family history of colonic polyps: Secondary | ICD-10-CM

## 2012-12-24 DIAGNOSIS — E785 Hyperlipidemia, unspecified: Secondary | ICD-10-CM

## 2012-12-24 DIAGNOSIS — K219 Gastro-esophageal reflux disease without esophagitis: Secondary | ICD-10-CM

## 2012-12-24 DIAGNOSIS — F410 Panic disorder [episodic paroxysmal anxiety] without agoraphobia: Secondary | ICD-10-CM

## 2012-12-24 DIAGNOSIS — K449 Diaphragmatic hernia without obstruction or gangrene: Secondary | ICD-10-CM

## 2012-12-24 DIAGNOSIS — R0789 Other chest pain: Secondary | ICD-10-CM

## 2012-12-24 DIAGNOSIS — Z0000 Encounter for general adult medical examination without abnormal findings: Secondary | ICD-10-CM

## 2012-12-24 MED ORDER — CITALOPRAM HYDROBROMIDE 40 MG PO TABS: ORAL_TABLET | ORAL | Status: DC

## 2012-12-24 MED ORDER — VALACYCLOVIR HCL 1 G PO TABS: 1000.0000 mg | ORAL_TABLET | Freq: Two times a day (BID) | ORAL | Status: DC

## 2012-12-24 MED ORDER — PANTOPRAZOLE SODIUM 40 MG PO TBEC: DELAYED_RELEASE_TABLET | ORAL | Status: DC

## 2012-12-24 NOTE — Patient Instructions (Signed)
Continue your current medications except stop the Zetia  Return in one year for general physical exam sooner if any problems  Continue your good health habits and exercise

## 2012-12-24 NOTE — Progress Notes (Signed)
  Subjective:    Patient ID: Gary [name], male    DOB: 11/01/1965, 47 y.o.   MRN: [id number]  HPI Khoury is a 47 year old married male nonsmoker recently got a promotion in the [employer] who comes in today for physical examination  He takes Celexa 40 mg daily because of a history of panic attacks  He also takes protonic 40 mg daily for chronic reflux esophagitis. He takes Valtrex 100 mg twice a day when necessary for HSV 1 and Zetia 10 mg along with an aspirin tablet for hyper lipidemia  He's done well with his diet and exercise program he's lost weight. He's down to 184 pounds  He gets routine eye care, dental care, colonoscopy at age 37 however his brother has a history of colon polyps. Recommend he get a colonoscopy this year.  Vaccinations up-to-date  He recently had a melanoma removed from his back by Amy Swaziland. He then had Mohs surgery by Dr. Irene Limbo. This was done this past winter   Review of Systems  Constitutional: Negative.   HENT: Negative.   Eyes: Negative.   Respiratory: Negative.   Cardiovascular: Negative.   Gastrointestinal: Negative.   Genitourinary: Negative.   Musculoskeletal: Negative.   Skin: Negative.   Neurological: Negative.   Psychiatric/Behavioral: Negative.        Objective:   Physical Exam  Constitutional: He is oriented to person, place, and time. He appears well-developed and well-nourished.  HENT:  Head: Normocephalic and atraumatic.  Right Ear: External ear normal.  Left Ear: External ear normal.  Nose: Nose normal.  Mouth/Throat: Oropharynx is clear and moist.  Eyes: Conjunctivae and EOM are normal. Pupils are equal, round, and reactive to light.  Neck: Normal range of motion. Neck supple. No JVD present. No tracheal deviation present. No thyromegaly present.  Cardiovascular: Normal rate, regular rhythm, normal heart sounds and intact distal pulses.  Exam reveals no gallop and no friction rub.   No murmur  heard. Pulmonary/Chest: Effort normal and breath sounds normal. No stridor. No respiratory distress. He has no wheezes. He has no rales. He exhibits no tenderness.  Abdominal: Soft. Bowel sounds are normal. He exhibits no distension and no mass. There is no tenderness. There is no rebound and no guarding.  Genitourinary: Rectum normal, prostate normal and penis normal. Guaiac negative stool. No penile tenderness.  Musculoskeletal: Normal range of motion. He exhibits no edema and no tenderness.  Lymphadenopathy:    He has no cervical adenopathy.  Neurological: He is alert and oriented to person, place, and time. He has normal reflexes. No cranial nerve deficit. He exhibits normal muscle tone.  Skin: Skin is warm and dry. No rash noted. No erythema. No pallor.  Scar on the right lower back from previous melanoma removal well healed  Recent total body skin exam 3 weeks ago by Dr. Swaziland. At that juncture she removed a lesion on his right upper back. The scab is present and healing  Psychiatric: He has a normal mood and affect. His behavior is normal. Judgment and thought content normal.          Assessment & Plan:  Healthy male  History of panic attacks continue Celexa 40 mg daily  History of chronic reflux esophagitis continue Protonix 40 mg daily  History of recurrent oral HSV 1 Valtrex 1000 mg twice a day when necessary  Hyperlipidemia continue aspirin statin and Zetia because of her weight loss and exercise

## 2012-12-31 ENCOUNTER — Encounter: Payer: Self-pay | Admitting: *Deleted

## 2013-01-05 ENCOUNTER — Ambulatory Visit (INDEPENDENT_AMBULATORY_CARE_PROVIDER_SITE_OTHER): Payer: 59 | Admitting: Gastroenterology

## 2013-01-05 ENCOUNTER — Encounter: Payer: Self-pay | Admitting: Gastroenterology

## 2013-01-05 VITALS — BP 100/62 | HR 78 | Ht 69.5 in | Wt 182.5 lb

## 2013-01-05 DIAGNOSIS — Z8371 Family history of colonic polyps: Secondary | ICD-10-CM

## 2013-01-05 DIAGNOSIS — K219 Gastro-esophageal reflux disease without esophagitis: Secondary | ICD-10-CM

## 2013-01-05 MED ORDER — PANTOPRAZOLE SODIUM 40 MG PO TBEC: DELAYED_RELEASE_TABLET | ORAL | Status: DC

## 2013-01-05 NOTE — Patient Instructions (Signed)
  You will be due for a colonoscopy in September 2015. We will send you a reminder in the mail when it gets closer to that time.  We have sent the following medications to your pharmacy for you to pick up at your convenience: Protonix ____________________________________________________________________                                               We are excited to introduce MyChart, a new best-in-class service that provides you online access to important information in your electronic medical record. We want to make it easier for you to view your health information - all in one secure location - when and where you need it. We expect MyChart will enhance the quality of care and service we provide.  When you register for MyChart, you can:    View your test results.    Request appointments and receive appointment reminders via email.    Request medication renewals.    View your medical history, allergies, medications and immunizations.    Communicate with your physician's office through a password-protected site.    Conveniently print information such as your medication lists.  To find out if MyChart is right for you, please talk to a member of our clinical staff today. We will gladly answer your questions about this free health and wellness tool.  If you are age 49 or older and want a member of your family to have access to your record, you must provide written consent by completing a proxy form available at our office. Please speak to our clinical staff about guidelines regarding accounts for patients younger than age 49.  As you activate your MyChart account and need any technical assistance, please call the MyChart technical support line at [phone]) or email your question to [email or web address]. If you email your question(s), please include your name, a return phone number and the best time to reach you.  If you have non-urgent health-related questions, you  can send a message to our office through MyChart at [email or web address]. If you have a medical emergency, call 911.  Thank you for using MyChart as your new health and wellness resource!   MyChart licensed from [employer],  4540-9811. Patents Pending.

## 2013-01-05 NOTE — Progress Notes (Signed)
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  He presented one year ago with atypical right-sided chest pain.  There is no history of dysphagia.  Endoscopy was in January 2013 showed no Barrett's mucosa, but the patient did have a 3 cm hiatal hernia.  He follows a regular diet denies any food intolerances.  The patient has regular bowel movements without melena or hematochezia.  He has a brother age 55 who recently had screening colonoscopy and had 2 colon polyps.  Family history otherwise is noncontributory.  Lab data done regular by Dr. Kelle Darting has been normal.  Patient denies any hepatobiliary or systemic complaints.  Current Medications, Allergies, Past Medical History, Past Surgical History, Family History and Social History were reviewed in [employer] record.  ROS: All systems were reviewed and are negative unless otherwise stated in the HPI.          Physical Exam: Healthy-appearing patient in no distress blood pressure 100/62, pulse 78 and regular, and weight 190 with a BMI of 27.74.  I cannot appreciate stigmata of chronic liver disease.  His chest is clear he appears to be in a regular rhythm without murmurs gallops or rubs.  There is no organomegaly, abdominal masses or tenderness.  Bowel sounds are normal.  Mental status is normal.    Assessment and Plan: GERD doing well on daily PPI therapy.  We have renewed his prescription, reviewed reflux precautions, and we'll schedule colonoscopy in September 2015 when he turns 48.  Patient appears to be doing well his continue other medications and followup with Dr. Tawanna Cooler as scheduled No diagnosis found.

## 2013-03-22 ENCOUNTER — Other Ambulatory Visit: Payer: Self-pay | Admitting: Family Medicine

## 2013-06-09 ENCOUNTER — Other Ambulatory Visit: Payer: Self-pay | Admitting: Dermatology

## 2013-07-05 ENCOUNTER — Other Ambulatory Visit: Payer: Self-pay | Admitting: Dermatology

## 2013-12-24 ENCOUNTER — Other Ambulatory Visit: Payer: Self-pay | Admitting: Family Medicine

## 2014-03-01 ENCOUNTER — Encounter: Payer: Self-pay | Admitting: Internal Medicine

## 2014-04-04 ENCOUNTER — Other Ambulatory Visit (INDEPENDENT_AMBULATORY_CARE_PROVIDER_SITE_OTHER): Payer: 59

## 2014-04-04 DIAGNOSIS — Z0000 Encounter for general adult medical examination without abnormal findings: Secondary | ICD-10-CM

## 2014-04-04 LAB — HEPATIC FUNCTION PANEL
ALBUMIN: 4.3 g/dL (ref 3.5–5.2)
ALK PHOS: 56 U/L (ref 39–117)
ALT: 24 U/L (ref 0–53)
AST: 32 U/L (ref 0–37)
BILIRUBIN DIRECT: 0 mg/dL (ref 0.0–0.3)
BILIRUBIN TOTAL: 0.8 mg/dL (ref 0.2–1.2)
Total Protein: 6.9 g/dL (ref 6.0–8.3)

## 2014-04-04 LAB — CBC WITH DIFFERENTIAL/PLATELET
BASOS ABS: 0 10*3/uL (ref 0.0–0.1)
Basophils Relative: 0.7 % (ref 0.0–3.0)
Eosinophils Absolute: 0.1 10*3/uL (ref 0.0–0.7)
Eosinophils Relative: 1.9 % (ref 0.0–5.0)
HEMATOCRIT: 44 % (ref 39.0–52.0)
Hemoglobin: 14.7 g/dL (ref 13.0–17.0)
LYMPHS ABS: 1.2 10*3/uL (ref 0.7–4.0)
Lymphocytes Relative: 18.9 % (ref 12.0–46.0)
MCHC: 33.4 g/dL (ref 30.0–36.0)
MCV: 91.8 fl (ref 78.0–100.0)
Monocytes Absolute: 0.5 10*3/uL (ref 0.1–1.0)
Monocytes Relative: 7.4 % (ref 3.0–12.0)
Neutro Abs: 4.5 10*3/uL (ref 1.4–7.7)
Neutrophils Relative %: 71.1 % (ref 43.0–77.0)
PLATELETS: 243 10*3/uL (ref 150.0–400.0)
RBC: 4.79 Mil/uL (ref 4.22–5.81)
RDW: 13.5 % (ref 11.5–15.5)
WBC: 6.4 10*3/uL (ref 4.0–10.5)

## 2014-04-04 LAB — POCT URINALYSIS DIPSTICK
Bilirubin, UA: NEGATIVE
Glucose, UA: NEGATIVE
Ketones, UA: NEGATIVE
Leukocytes, UA: NEGATIVE
NITRITE UA: NEGATIVE
PH UA: 7
PROTEIN UA: NEGATIVE
RBC UA: NEGATIVE
Spec Grav, UA: 1.015
UROBILINOGEN UA: 0.2

## 2014-04-04 LAB — PSA: PSA: 2.19 ng/mL (ref 0.10–4.00)

## 2014-04-04 LAB — BASIC METABOLIC PANEL
BUN: 13 mg/dL (ref 6–23)
CO2: 30 mEq/L (ref 19–32)
Calcium: 9.3 mg/dL (ref 8.4–10.5)
Chloride: 105 mEq/L (ref 96–112)
Creatinine, Ser: 1.1 mg/dL (ref 0.4–1.5)
GFR: 73.75 mL/min (ref >60.00)
Glucose, Bld: 84 mg/dL (ref 70–99)
POTASSIUM: 5.2 meq/L — AB (ref 3.5–5.1)
SODIUM: 142 meq/L (ref 135–145)

## 2014-04-04 LAB — LIPID PANEL
Cholesterol: 279 mg/dL — ABNORMAL HIGH (ref 0–200)
HDL: 41.9 mg/dL (ref >39.00)
LDL Cholesterol: 200 mg/dL — ABNORMAL HIGH (ref 0–99)
NONHDL: 237.1
Total CHOL/HDL Ratio: 7
Triglycerides: 188 mg/dL — ABNORMAL HIGH (ref 0.0–149.0)
VLDL: 37.6 mg/dL (ref 0.0–40.0)

## 2014-04-04 LAB — TSH: TSH: 1.19 u[IU]/mL (ref 0.35–4.50)

## 2014-04-11 ENCOUNTER — Ambulatory Visit (INDEPENDENT_AMBULATORY_CARE_PROVIDER_SITE_OTHER): Payer: 59 | Admitting: Family Medicine

## 2014-04-11 ENCOUNTER — Encounter: Payer: Self-pay | Admitting: Family Medicine

## 2014-04-11 VITALS — BP 120/80 | Temp 98.2°F | Ht 68.5 in | Wt 190.0 lb

## 2014-04-11 DIAGNOSIS — E785 Hyperlipidemia, unspecified: Secondary | ICD-10-CM

## 2014-04-11 DIAGNOSIS — Z0000 Encounter for general adult medical examination without abnormal findings: Secondary | ICD-10-CM

## 2014-04-11 DIAGNOSIS — K219 Gastro-esophageal reflux disease without esophagitis: Secondary | ICD-10-CM

## 2014-04-11 DIAGNOSIS — K449 Diaphragmatic hernia without obstruction or gangrene: Secondary | ICD-10-CM

## 2014-04-11 DIAGNOSIS — F410 Panic disorder [episodic paroxysmal anxiety] without agoraphobia: Secondary | ICD-10-CM

## 2014-04-11 MED ORDER — PANTOPRAZOLE SODIUM 40 MG PO TBEC: DELAYED_RELEASE_TABLET | ORAL | Status: DC

## 2014-04-11 MED ORDER — VALACYCLOVIR HCL 1 G PO TABS: 1000.0000 mg | ORAL_TABLET | Freq: Two times a day (BID) | ORAL | Status: DC

## 2014-04-11 MED ORDER — CITALOPRAM HYDROBROMIDE 40 MG PO TABS: ORAL_TABLET | ORAL | Status: DC

## 2014-04-11 MED ORDER — PRAVASTATIN SODIUM 20 MG PO TABS: 20.0000 mg | ORAL_TABLET | Freq: Every day | ORAL | Status: DC

## 2014-04-11 NOTE — Progress Notes (Signed)
Pre visit review using our clinic review tool, if applicable. No additional management support is needed unless otherwise documented below in the visit note. 

## 2014-04-11 NOTE — Patient Instructions (Addendum)
Continue your diet and exercise program  Continue your current medications  Call GI and make an appointment for your colonoscopy  Return in one year sooner if any problems  Pravachol 20 mg........... one tablet at bedtime  Return in 2 months for followup................ fasting labs one week prior

## 2014-04-11 NOTE — Progress Notes (Signed)
   Subjective:    Patient ID: Gary [name], male    DOB: May 15, 1966, 48 y.o.   MRN: [id number]  HPI Calogero is a 48 year old married male nonsmoker who comes in today for general physical examination. He has a history of panic disorder and takes Celexa 40 mg daily. 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   HENT: Negative.   Eyes: Negative.   Respiratory: Negative.   Cardiovascular: Negative.   Gastrointestinal: Negative.   Genitourinary: Negative.   Musculoskeletal: Negative.   Skin: Negative.   Neurological: Negative.   Psychiatric/Behavioral: Negative.        Objective:   Physical Exam  Nursing note and vitals reviewed. Constitutional: He is oriented to person, place, and time. He appears well-developed and well-nourished.  HENT:  Head: Normocephalic and atraumatic.  Right Ear: External ear normal.  Left Ear: External ear normal.  Nose: Nose normal.  Mouth/Throat: Oropharynx is clear and moist.  Eyes: Conjunctivae and EOM are normal. Pupils are equal, round, and reactive to light.  Neck: Normal range of motion. Neck supple. No JVD present. No tracheal deviation present. No thyromegaly present.  Cardiovascular: Normal rate, regular rhythm, normal heart sounds and intact distal pulses.  Exam reveals no gallop and no friction rub.   No murmur heard. No carotid artery bruits the for pulses 2+ and symmetrical  Pulmonary/Chest: Effort normal and breath sounds normal. No stridor. No respiratory distress. He has no wheezes. He has no rales.  He exhibits no tenderness.  Abdominal: Soft. Bowel sounds are normal. He exhibits no distension and no mass. There is no tenderness. There is no rebound and no guarding.  Genitourinary: Rectum normal, prostate normal and penis normal. Guaiac negative stool. No penile tenderness.  Musculoskeletal: Normal range of motion. He exhibits no edema and no tenderness.  Lymphadenopathy:    He has no cervical adenopathy.  Neurological: He is alert and oriented to person, place, and time. He has normal reflexes. No cranial nerve deficit. He exhibits normal muscle tone.  Skin: Skin is warm and dry. No rash noted. No erythema. No pallor.  Multiple small scars on his back we had lesions removed and a 6 and scar right lumbar area we had a melanoma removed last year by Dr. Martinique  Psychiatric: He has a normal mood and affect. His behavior is normal. Judgment and thought content normal.          Assessment & Plan:  Healthy maleHistory of panic attacks continue Celex 40 mg daily  Reflux esophagitis Protonix 40 mg daily  Occasional oral HSV 1 acyclovir your 1000 mg twice a day when necessary  History of melanoma......... followup by Dr. Martinique as outlined

## 2014-04-12 ENCOUNTER — Encounter: Payer: Self-pay | Admitting: Internal Medicine

## 2014-05-09 ENCOUNTER — Other Ambulatory Visit: Payer: Self-pay | Admitting: Family Medicine

## 2014-06-01 ENCOUNTER — Ambulatory Visit (AMBULATORY_SURGERY_CENTER): Payer: 59 | Admitting: *Deleted

## 2014-06-01 VITALS — Ht 70.0 in | Wt 190.8 lb

## 2014-06-01 DIAGNOSIS — Z83719 Family history of colon polyps, unspecified: Secondary | ICD-10-CM

## 2014-06-01 DIAGNOSIS — Z8371 Family history of colonic polyps: Secondary | ICD-10-CM

## 2014-06-01 MED ORDER — MOVIPREP 100 G PO SOLR: 1.0000 | Freq: Once | ORAL | Status: DC

## 2014-06-01 NOTE — Progress Notes (Signed)
No egg or soy allergy. ewm No issues with past sedation. ewm No home 02 use. ewm No blood thinners, no diet pills. ewm

## 2014-06-06 ENCOUNTER — Other Ambulatory Visit (INDEPENDENT_AMBULATORY_CARE_PROVIDER_SITE_OTHER): Payer: 59

## 2014-06-06 DIAGNOSIS — E785 Hyperlipidemia, unspecified: Secondary | ICD-10-CM

## 2014-06-06 LAB — HEPATIC FUNCTION PANEL
ALT: 28 U/L (ref 0–53)
AST: 34 U/L (ref 0–37)
Albumin: 4.3 g/dL (ref 3.5–5.2)
Alkaline Phosphatase: 56 U/L (ref 39–117)
Bilirubin, Direct: 0.1 mg/dL (ref 0.0–0.3)
TOTAL PROTEIN: 6.8 g/dL (ref 6.0–8.3)
Total Bilirubin: 0.9 mg/dL (ref 0.2–1.2)

## 2014-06-06 LAB — LIPID PANEL
CHOL/HDL RATIO: 5
CHOLESTEROL: 218 mg/dL — AB (ref 0–200)
HDL: 46.6 mg/dL (ref >39.00)
LDL Cholesterol: 153 mg/dL — ABNORMAL HIGH (ref 0–99)
NonHDL: 171.4
Triglycerides: 90 mg/dL (ref 0.0–149.0)
VLDL: 18 mg/dL (ref 0.0–40.0)

## 2014-06-13 ENCOUNTER — Ambulatory Visit (INDEPENDENT_AMBULATORY_CARE_PROVIDER_SITE_OTHER): Payer: 59 | Admitting: Family Medicine

## 2014-06-13 ENCOUNTER — Encounter: Payer: Self-pay | Admitting: Family Medicine

## 2014-06-13 VITALS — BP 110/70 | Temp 98.0°F | Wt 186.0 lb

## 2014-06-13 DIAGNOSIS — E785 Hyperlipidemia, unspecified: Secondary | ICD-10-CM

## 2014-06-13 NOTE — Progress Notes (Signed)
Pre visit review using our clinic review tool, if applicable. No additional management support is needed unless otherwise documented below in the visit note. 

## 2014-06-13 NOTE — Patient Instructions (Signed)
Continue current medications  Follow-up the fourth week in September for your physical examination           Monday or Tuesday  Labs one week prior,,,,,,,,,,,,,

## 2014-06-13 NOTE — Progress Notes (Signed)
   Subjective:    Patient ID: Gary [name], male    DOB: 1966/01/20, 48 y.o.   MRN: [id number]  HPI Gary [name] is a 48 year old male nonsmoker who comes in today for follow-up of hyperlipidemia  He has a family history of hyperlipidemia and he is controlled his elevated cholesterol and high triglycerides and high LDL with diet exercise. In the past we tried taking Lipitor however he had severe muscle and joint pain. Recently his despite his exercise and diet etc. his LDL went up to over 200. We therefore started on Pravachol 20 mg daily. He comes back today for follow-up. He states he has no side effects from the Pravachol like he did the Lipitor. Total cholesterol has dropped as his triglycerides and LDL is 158.   Review of Systems Review of systems negative    Objective:   Physical Exam  Well-developed well-nourished male no acute distress vital signs stable he is afebrile      Assessment & Plan:  Hyperlipidemia at goal.......... continue current therapy follow-up in September 2016

## 2014-06-15 ENCOUNTER — Ambulatory Visit (AMBULATORY_SURGERY_CENTER): Payer: 59 | Admitting: Internal Medicine

## 2014-06-15 ENCOUNTER — Encounter: Payer: Self-pay | Admitting: Internal Medicine

## 2014-06-15 VITALS — BP 116/84 | HR 76 | Temp 96.5°F | Resp 17 | Ht 70.0 in | Wt 190.0 lb

## 2014-06-15 DIAGNOSIS — Z1211 Encounter for screening for malignant neoplasm of colon: Secondary | ICD-10-CM

## 2014-06-15 DIAGNOSIS — D125 Benign neoplasm of sigmoid colon: Secondary | ICD-10-CM

## 2014-06-15 MED ORDER — SODIUM CHLORIDE 0.9 % IV SOLN: 500.0000 mL | INTRAVENOUS | Status: DC

## 2014-06-15 NOTE — Op Note (Signed)
[place]  [address]. [place], [id number]   COLONOSCOPY PROCEDURE REPORT  PATIENT: Gary [name], Gary [name]  MR#: [id number] BIRTHDATE: January 02, 1966 , 48  yrs. old GENDER: male ENDOSCOPIST: Jerene Bears, MD REFERRED TK:[id number] Delora Fuel, M.D. PROCEDURE DATE:  06/15/2014 PROCEDURE:   Colonoscopy with snare polypectomy First Screening Colonoscopy - Avg.  risk and is 50 yrs.  old or older Yes.  Prior Negative Screening - Now for repeat screening. N/A  History of Adenoma - Now for follow-up colonoscopy & has been > or = to 3 yrs.  N/A  Polyps Removed Today? Yes. ASA CLASS:   Class II INDICATIONS:average risk for colon cancer and first colonoscopy. MEDICATIONS: Monitored anesthesia care and Propofol 300 mg IV  DESCRIPTION OF PROCEDURE:   After the risks benefits and alternatives of the procedure were thoroughly explained, informed consent was obtained.  The digital rectal exam revealed no rectal mass.   The LB [id number]  endoscope was introduced through the anus and advanced to the cecum, which was identified by both the appendix and ileocecal valve. No adverse events experienced. The quality of the prep was excellent, using MoviPrep  The instrument was then slowly withdrawn as the colon was fully examined.   COLON FINDINGS: Two sessile polyps ranging from 3 to 47mm in size were found in the sigmoid colon.  Polypectomies were performed with a cold snare.  The resection was complete, the polyp tissue was completely retrieved and sent to histology.   There was mild diverticulosis noted in the sigmoid colon with associated muscular hypertrophy.  Retroflexed views revealed no abnormalities. The time to cecum=3 minutes 20 seconds.  Withdrawal time=12 minutes 22 seconds.  The scope was withdrawn and the procedure completed. COMPLICATIONS: There were no immediate complications.  ENDOSCOPIC IMPRESSION: 1.   Two sessile polyps ranging from 3 to 73mm in size were found in the  sigmoid colon; polypectomies were performed with a cold snare 2.   There was mild diverticulosis noted in the sigmoid colon  RECOMMENDATIONS: 1.  Await pathology results 2.  High fiber diet 3.  If the polyps removed today are proven to be adenomatous (pre-cancerous) polyps, you will need a repeat colonoscopy in 5 years.  Otherwise you should continue to follow colorectal cancer screening guidelines for "routine risk" patients with colonoscopy in 10 years.  You will receive a letter within 1-2 weeks with the results of your biopsy as well as final recommendations.  Please call my office if you have not received a letter after 3 weeks.  eSigned:  Jerene Bears, MD 06/15/2014 9:55 AM  cc: The Patient and Dorena Cookey, MD

## 2014-06-15 NOTE — Progress Notes (Signed)
A/ox3 pleased with MAC, report to Annette RN 

## 2014-06-15 NOTE — Progress Notes (Signed)
Called to room to assist during endoscopic procedure.  Patient ID and intended procedure confirmed with present staff. Received instructions for my participation in the procedure from the performing physician.  

## 2014-06-15 NOTE — Progress Notes (Signed)
No problems noted in the recovery room. maw 

## 2014-06-15 NOTE — Patient Instructions (Signed)
YOU HAD AN ENDOSCOPIC PROCEDURE TODAY AT THE [facility]: Refer to the procedure report that was given to you for any specific questions about what was found during the examination.  If the procedure report does not answer your questions, please call your gastroenterologist to clarify.  If you requested that your care partner not be given the details of your procedure findings, then the procedure report has been included in a sealed envelope for you to review at your convenience later.  YOU SHOULD EXPECT: Some feelings of bloating in the abdomen. Passage of more gas than usual.  Walking can help get rid of the air that was put into your GI tract during the procedure and reduce the bloating. If you had a lower endoscopy (such as a colonoscopy or flexible sigmoidoscopy) you may notice spotting of blood in your stool or on the toilet paper. If you underwent a bowel prep for your procedure, then you may not have a normal bowel movement for a few days.  DIET: Your first meal following the procedure should be a light meal and then it is ok to progress to your normal diet.  A half-sandwich or bowl of soup is an example of a good first meal.  Heavy or fried foods are harder to digest and may make you feel nauseous or bloated.  Likewise meals heavy in dairy and vegetables can cause extra gas to form and this can also increase the bloating.  Drink plenty of fluids but you should avoid alcoholic beverages for 24 hours.  ACTIVITY: Your care partner should take you home directly after the procedure.  You should plan to take it easy, moving slowly for the rest of the day.  You can resume normal activity the day after the procedure however you should NOT DRIVE or use heavy machinery for 24 hours (because of the sedation medicines used during the test).    SYMPTOMS TO REPORT IMMEDIATELY: A gastroenterologist can be reached at any hour.  During normal business hours, 8:30 AM to 5:00 PM Monday through Friday,  call [phone].  After hours and on weekends, please call the GI answering service at [phone] who will take a message and have the physician on call contact you.   Following lower endoscopy (colonoscopy or flexible sigmoidoscopy):  Excessive amounts of blood in the stool  Significant tenderness or worsening of abdominal pains  Swelling of the abdomen that is new, acute  Fever of 100F or higher  FOLLOW UP: If any biopsies were taken you will be contacted by phone or by letter within the next 1-3 weeks.  Call your gastroenterologist if you have not heard about the biopsies in 3 weeks.  Our staff will call the home number listed on your records the next business day following your procedure to check on you and address any questions or concerns that you may have at that time regarding the information given to you following your procedure. This is a courtesy call and so if there is no answer at the home number and we have not heard from you through the emergency physician on call, we will assume that you have returned to your regular daily activities without incident.  SIGNATURES/CONFIDENTIALITY: You and/or your care partner have signed paperwork which will be entered into your electronic medical record.  These signatures attest to the fact that that the information above on your After Visit Summary has been reviewed and is understood.  Full responsibility of the confidentiality of this   discharge information lies with you and/or your care-partner.     Handouts were given to your care partner on polyps, diverticulosis, and a high fiber diet with liberal fluid intake. You may resume your current medications today. Await biopsy results. Please call if any questions or concerns.   

## 2014-06-16 ENCOUNTER — Telehealth: Payer: Self-pay | Admitting: *Deleted

## 2014-06-16 NOTE — Telephone Encounter (Signed)
No answer, no answering machine, unable to leave message for the patient.

## 2014-06-20 ENCOUNTER — Encounter: Payer: Self-pay | Admitting: Internal Medicine

## 2015-04-10 ENCOUNTER — Other Ambulatory Visit: Payer: Self-pay

## 2015-04-17 ENCOUNTER — Encounter: Payer: 59 | Admitting: Family Medicine

## 2015-04-17 ENCOUNTER — Other Ambulatory Visit (INDEPENDENT_AMBULATORY_CARE_PROVIDER_SITE_OTHER): Payer: 59

## 2015-04-17 DIAGNOSIS — Z0000 Encounter for general adult medical examination without abnormal findings: Secondary | ICD-10-CM | POA: Diagnosis not present

## 2015-04-17 LAB — CBC WITH DIFFERENTIAL/PLATELET
Basophils Absolute: 0 K/uL (ref 0.0–0.1)
Basophils Relative: 0.5 % (ref 0.0–3.0)
Eosinophils Absolute: 0.1 K/uL (ref 0.0–0.7)
Eosinophils Relative: 1.8 % (ref 0.0–5.0)
HCT: 45.5 % (ref 39.0–52.0)
Hemoglobin: 15.5 g/dL (ref 13.0–17.0)
Lymphocytes Relative: 18.1 % (ref 12.0–46.0)
Lymphs Abs: 1.2 K/uL (ref 0.7–4.0)
MCHC: 34 g/dL (ref 30.0–36.0)
MCV: 89.9 fl (ref 78.0–100.0)
Monocytes Absolute: 0.6 K/uL (ref 0.1–1.0)
Monocytes Relative: 8.1 % (ref 3.0–12.0)
Neutro Abs: 4.9 K/uL (ref 1.4–7.7)
Neutrophils Relative %: 71.5 % (ref 43.0–77.0)
Platelets: 242 K/uL (ref 150.0–400.0)
RBC: 5.06 Mil/uL (ref 4.22–5.81)
RDW: 13.4 % (ref 11.5–15.5)
WBC: 6.8 K/uL (ref 4.0–10.5)

## 2015-04-17 LAB — POCT URINALYSIS DIPSTICK
BILIRUBIN UA: NEGATIVE
Blood, UA: NEGATIVE
Glucose, UA: NEGATIVE
Ketones, UA: NEGATIVE
Leukocytes, UA: NEGATIVE
NITRITE UA: NEGATIVE
PH UA: 7
PROTEIN UA: NEGATIVE
Spec Grav, UA: 1.01
Urobilinogen, UA: 0.2

## 2015-04-17 LAB — HEPATIC FUNCTION PANEL
ALT: 26 U/L (ref 0–53)
AST: 27 U/L (ref 0–37)
Albumin: 4.4 g/dL (ref 3.5–5.2)
Alkaline Phosphatase: 57 U/L (ref 39–117)
Bilirubin, Direct: 0.1 mg/dL (ref 0.0–0.3)
Total Bilirubin: 0.4 mg/dL (ref 0.2–1.2)
Total Protein: 6.7 g/dL (ref 6.0–8.3)

## 2015-04-17 LAB — BASIC METABOLIC PANEL
BUN: 16 mg/dL (ref 6–23)
CHLORIDE: 103 meq/L (ref 96–112)
CO2: 32 meq/L (ref 19–32)
CREATININE: 1.06 mg/dL (ref 0.40–1.50)
Calcium: 9.6 mg/dL (ref 8.4–10.5)
GFR: 78.27 mL/min (ref >60.00)
Glucose, Bld: 85 mg/dL (ref 70–99)
Potassium: 4.6 mEq/L (ref 3.5–5.1)
Sodium: 142 mEq/L (ref 135–145)

## 2015-04-17 LAB — TSH: TSH: 0.92 u[IU]/mL (ref 0.35–4.50)

## 2015-04-17 LAB — LIPID PANEL
CHOL/HDL RATIO: 4
Cholesterol: 203 mg/dL — ABNORMAL HIGH (ref 0–200)
HDL: 47.3 mg/dL (ref >39.00)
LDL Cholesterol: 129 mg/dL — ABNORMAL HIGH (ref 0–99)
NonHDL: 155.23
TRIGLYCERIDES: 129 mg/dL (ref 0.0–149.0)
VLDL: 25.8 mg/dL (ref 0.0–40.0)

## 2015-04-17 LAB — PSA: PSA: 2.29 ng/mL (ref 0.10–4.00)

## 2015-04-25 ENCOUNTER — Encounter: Payer: Self-pay | Admitting: Family Medicine

## 2015-04-25 ENCOUNTER — Ambulatory Visit (INDEPENDENT_AMBULATORY_CARE_PROVIDER_SITE_OTHER): Payer: 59 | Admitting: Family Medicine

## 2015-04-25 VITALS — BP 120/86 | Temp 97.5°F | Ht 69.5 in | Wt 190.0 lb

## 2015-04-25 DIAGNOSIS — K219 Gastro-esophageal reflux disease without esophagitis: Secondary | ICD-10-CM

## 2015-04-25 DIAGNOSIS — Z0000 Encounter for general adult medical examination without abnormal findings: Secondary | ICD-10-CM

## 2015-04-25 DIAGNOSIS — F410 Panic disorder [episodic paroxysmal anxiety] without agoraphobia: Secondary | ICD-10-CM

## 2015-04-25 DIAGNOSIS — Z85820 Personal history of malignant melanoma of skin: Secondary | ICD-10-CM

## 2015-04-25 DIAGNOSIS — Z860100 Personal history of colon polyps, unspecified: Secondary | ICD-10-CM | POA: Insufficient documentation

## 2015-04-25 DIAGNOSIS — E785 Hyperlipidemia, unspecified: Secondary | ICD-10-CM

## 2015-04-25 DIAGNOSIS — M542 Cervicalgia: Secondary | ICD-10-CM | POA: Insufficient documentation

## 2015-04-25 MED ORDER — VALACYCLOVIR HCL 1 G PO TABS: 1000.0000 mg | ORAL_TABLET | Freq: Two times a day (BID) | ORAL | Status: DC

## 2015-04-25 MED ORDER — CITALOPRAM HYDROBROMIDE 40 MG PO TABS: ORAL_TABLET | ORAL | Status: DC

## 2015-04-25 MED ORDER — PANTOPRAZOLE SODIUM 40 MG PO TBEC: DELAYED_RELEASE_TABLET | ORAL | Status: DC

## 2015-04-25 MED ORDER — PRAVASTATIN SODIUM 20 MG PO TABS
ORAL_TABLET | ORAL | Status: DC
Start: 2015-04-25 — End: 2016-05-15

## 2015-04-25 NOTE — Progress Notes (Signed)
Pre visit review using our clinic review tool, if applicable. No additional management support is needed unless otherwise documented below in the visit note. 

## 2015-04-25 NOTE — Patient Instructions (Signed)
All your medicines remain the same except for going to increase the Pravachol to 30 mg daily  Follow-up in one year sooner if any problems  Remember to walk 30 minutes daily this also helps with your cholesterol and your total body health  Beaulah Dinning our new adult nurse practitioner from [facility]

## 2015-04-25 NOTE — Progress Notes (Signed)
Subjective:    Patient ID: Gary [name], male    DOB: 04/08/1966, 49 y.o.   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 It does not sound like a disc problem. I'll have him get set up to see Marlowe Kays  Social history he is divorced he has a girlfriend who is a Customer service manager. He's getting ready retire soon. 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   HENT: Negative.   Eyes: Negative.   Respiratory: Negative.   Cardiovascular: Negative.   Gastrointestinal: Negative.   Endocrine: Negative.   Genitourinary: Negative.   Musculoskeletal: Negative.   Skin: Negative.   Allergic/Immunologic: Negative.   Neurological: Negative.   Hematological: Negative.   Psychiatric/Behavioral: Negative.        Objective:   Physical Exam  Constitutional: He is oriented to person, place, and time. He appears well-developed and well-nourished.  HENT:  Head: Normocephalic and atraumatic.  Right Ear: External ear normal.  Left Ear: External ear normal.  Nose: Nose normal.  Mouth/Throat: Oropharynx is clear and  moist.  Eyes: Conjunctivae and EOM are normal. Pupils are equal, round, and reactive to light.  Neck: Normal range of motion. Neck supple. No JVD present. No tracheal deviation present. No thyromegaly present.  Cardiovascular: Normal rate, regular rhythm, normal heart sounds and intact distal pulses.  Exam reveals no gallop and no friction rub.   No murmur heard. No carotid nor aortic bruits peripheral pulses 2+ and symmetrical  Pulmonary/Chest: Effort normal and breath sounds normal. No stridor. No respiratory distress. He has no wheezes. He has no rales. He exhibits no tenderness.  Abdominal: Soft. Bowel sounds are normal. He exhibits no distension and no mass. There is no tenderness. There is no rebound and no guarding.  Genitourinary: Rectum normal, prostate normal and penis normal. Guaiac negative stool. No penile tenderness.  Musculoskeletal: Normal range of motion. He exhibits no edema or tenderness.  Lymphadenopathy:    He has no cervical adenopathy.  Neurological: He is alert and oriented to person, place, and time. He has normal reflexes. No cranial nerve deficit. He exhibits normal muscle tone.  Skin: Skin is warm and dry. No rash noted. No erythema. No pallor.  Total body skin exam normal except for numerous scars resaid lesions removed. The most significant is a 6 inch scar in the lower back where he had a melanoma removed. He sees his dermatologist every 6 months now  Psychiatric: He has a normal mood and affect. His behavior is normal. Judgment and thought content normal.  Nursing note and vitals reviewed.  Assessment & Plan:  Healthy male  History of depression......... continue Celexa 40 mg daily  Reflux esophagitis............. Protonix 40 mg daily when necessary  Hyperlipidemia............ increase the Pravachol to 30 mg daily to get his LDL below 100  Oral HSV........ Sporadically...........Marland Kitchen Valtrex 1 g twice a day when necessary  History of melanoma  ........ continue followed by dermatology ...Marland KitchenMarland KitchenMarland Kitchen  Cervical stiffness with some anterior posturing....... consult with Noel Gerold

## 2015-05-01 ENCOUNTER — Other Ambulatory Visit: Payer: Self-pay | Admitting: Family Medicine

## 2015-08-01 ENCOUNTER — Other Ambulatory Visit: Payer: Self-pay | Admitting: Family Medicine

## 2015-08-01 ENCOUNTER — Other Ambulatory Visit: Payer: Self-pay | Admitting: *Deleted

## 2015-08-01 DIAGNOSIS — Z0000 Encounter for general adult medical examination without abnormal findings: Secondary | ICD-10-CM

## 2015-08-01 MED ORDER — VALACYCLOVIR HCL 1 G PO TABS: 1000.0000 mg | ORAL_TABLET | Freq: Two times a day (BID) | ORAL | Status: DC

## 2016-05-15 ENCOUNTER — Other Ambulatory Visit: Payer: Self-pay | Admitting: Family Medicine

## 2016-05-15 DIAGNOSIS — E785 Hyperlipidemia, unspecified: Secondary | ICD-10-CM

## 2016-06-25 ENCOUNTER — Other Ambulatory Visit: Payer: Self-pay | Admitting: Family Medicine

## 2016-09-26 ENCOUNTER — Other Ambulatory Visit (INDEPENDENT_AMBULATORY_CARE_PROVIDER_SITE_OTHER): Payer: 59

## 2016-09-26 DIAGNOSIS — Z0000 Encounter for general adult medical examination without abnormal findings: Secondary | ICD-10-CM

## 2016-09-26 DIAGNOSIS — R7989 Other specified abnormal findings of blood chemistry: Secondary | ICD-10-CM | POA: Diagnosis not present

## 2016-09-26 LAB — BASIC METABOLIC PANEL
BUN: 16 mg/dL (ref 6–23)
CALCIUM: 9.6 mg/dL (ref 8.4–10.5)
CO2: 30 meq/L (ref 19–32)
Chloride: 104 mEq/L (ref 96–112)
Creatinine, Ser: 1.2 mg/dL (ref 0.40–1.50)
GFR: 67.44 mL/min (ref >60.00)
GLUCOSE: 105 mg/dL — AB (ref 70–99)
Potassium: 4.4 mEq/L (ref 3.5–5.1)
SODIUM: 140 meq/L (ref 135–145)

## 2016-09-26 LAB — CBC WITH DIFFERENTIAL/PLATELET
BASOS ABS: 0 10*3/uL (ref 0.0–0.1)
Basophils Relative: 0.6 % (ref 0.0–3.0)
Eosinophils Absolute: 0.1 10*3/uL (ref 0.0–0.7)
Eosinophils Relative: 2.3 % (ref 0.0–5.0)
HCT: 44.5 % (ref 39.0–52.0)
Hemoglobin: 15.2 g/dL (ref 13.0–17.0)
LYMPHS ABS: 1.4 10*3/uL (ref 0.7–4.0)
Lymphocytes Relative: 22.1 % (ref 12.0–46.0)
MCHC: 34.1 g/dL (ref 30.0–36.0)
MCV: 88.4 fl (ref 78.0–100.0)
MONOS PCT: 8.9 % (ref 3.0–12.0)
Monocytes Absolute: 0.6 10*3/uL (ref 0.1–1.0)
NEUTROS ABS: 4.3 10*3/uL (ref 1.4–7.7)
NEUTROS PCT: 66.1 % (ref 43.0–77.0)
PLATELETS: 229 10*3/uL (ref 150.0–400.0)
RBC: 5.04 Mil/uL (ref 4.22–5.81)
RDW: 13.4 % (ref 11.5–15.5)
WBC: 6.4 10*3/uL (ref 4.0–10.5)

## 2016-09-26 LAB — LIPID PANEL
CHOLESTEROL: 192 mg/dL (ref 0–200)
HDL: 44.9 mg/dL (ref >39.00)
NonHDL: 146.83
TRIGLYCERIDES: 209 mg/dL — AB (ref 0.0–149.0)
Total CHOL/HDL Ratio: 4
VLDL: 41.8 mg/dL — ABNORMAL HIGH (ref 0.0–40.0)

## 2016-09-26 LAB — POC URINALSYSI DIPSTICK (AUTOMATED)
Bilirubin, UA: NEGATIVE
Blood, UA: NEGATIVE
Glucose, UA: NEGATIVE
Ketones, UA: NEGATIVE
LEUKOCYTES UA: NEGATIVE
NITRITE UA: NEGATIVE
PROTEIN UA: NEGATIVE
Spec Grav, UA: 1.03 (ref 1.030–1.035)
UROBILINOGEN UA: 0.2 (ref ?–2.0)
pH, UA: 6 (ref 5.0–8.0)

## 2016-09-26 LAB — PSA: PSA: 2.28 ng/mL (ref 0.10–4.00)

## 2016-09-26 LAB — HEPATIC FUNCTION PANEL
ALT: 23 U/L (ref 0–53)
AST: 22 U/L (ref 0–37)
Albumin: 4.5 g/dL (ref 3.5–5.2)
Alkaline Phosphatase: 55 U/L (ref 39–117)
Bilirubin, Direct: 0.1 mg/dL (ref 0.0–0.3)
Total Bilirubin: 0.4 mg/dL (ref 0.2–1.2)
Total Protein: 6.6 g/dL (ref 6.0–8.3)

## 2016-09-26 LAB — LDL CHOLESTEROL, DIRECT: Direct LDL: 118 mg/dL

## 2016-09-26 LAB — TSH: TSH: 1.2 u[IU]/mL (ref 0.35–4.50)

## 2016-10-02 ENCOUNTER — Encounter: Payer: Self-pay | Admitting: Family Medicine

## 2016-10-02 ENCOUNTER — Ambulatory Visit (INDEPENDENT_AMBULATORY_CARE_PROVIDER_SITE_OTHER): Payer: 59 | Admitting: Family Medicine

## 2016-10-02 VITALS — BP 112/72 | Temp 98.3°F | Ht 69.0 in | Wt 203.0 lb

## 2016-10-02 DIAGNOSIS — Z0000 Encounter for general adult medical examination without abnormal findings: Secondary | ICD-10-CM

## 2016-10-02 DIAGNOSIS — E785 Hyperlipidemia, unspecified: Secondary | ICD-10-CM

## 2016-10-02 DIAGNOSIS — Z85820 Personal history of malignant melanoma of skin: Secondary | ICD-10-CM | POA: Diagnosis not present

## 2016-10-02 DIAGNOSIS — K449 Diaphragmatic hernia without obstruction or gangrene: Secondary | ICD-10-CM

## 2016-10-02 DIAGNOSIS — Z136 Encounter for screening for cardiovascular disorders: Secondary | ICD-10-CM

## 2016-10-02 DIAGNOSIS — F410 Panic disorder [episodic paroxysmal anxiety] without agoraphobia: Secondary | ICD-10-CM | POA: Diagnosis not present

## 2016-10-02 DIAGNOSIS — K219 Gastro-esophageal reflux disease without esophagitis: Secondary | ICD-10-CM | POA: Diagnosis not present

## 2016-10-02 MED ORDER — CITALOPRAM HYDROBROMIDE 40 MG PO TABS: 40.0000 mg | ORAL_TABLET | Freq: Every day | ORAL | 4 refills | Status: DC

## 2016-10-02 MED ORDER — PANTOPRAZOLE SODIUM 40 MG PO TBEC: DELAYED_RELEASE_TABLET | ORAL | 4 refills | Status: DC

## 2016-10-02 MED ORDER — PRAVASTATIN SODIUM 20 MG PO TABS: 30.0000 mg | ORAL_TABLET | Freq: Every day | ORAL | 4 refills | Status: DC

## 2016-10-02 MED ORDER — VALACYCLOVIR HCL 1 G PO TABS: 1000.0000 mg | ORAL_TABLET | Freq: Two times a day (BID) | ORAL | 2 refills | Status: DC

## 2016-10-02 MED FILL — CITALOPRAM HBR 40 MG TABLET: 40 | 30 days supply | Qty: 30 | Fill #0

## 2016-10-02 MED FILL — PANTOPRAZOLE SOD DR 40 MG T: 40 | 30 days supply | Qty: 30 | Fill #0

## 2016-10-02 MED FILL — PRAVASTATIN NA 20 MG TAB: 20 | 30 days supply | Qty: 45 | Fill #0

## 2016-10-02 NOTE — Progress Notes (Signed)
Gary [name] is a 51 year old recently remarried male nonsmoker who comes in today for general physical examination because of a history of mild depression, reflux esophagitis, hyperlipidemia, and occasional HSV-1  He gets routine eye care, dental care, colonoscopy 2015 showed some polyps. He's due to go back in 2020.  Vaccinations up-to-date  Review of systems negative except that he is retired from [employer] and now works Land at [employer]. Recently got remarried. 32 year old son lives with him.  Review of systems 14 points reviewed and otherwise negative  BP 112/72 (BP Location: Right Arm, Patient Position: Sitting, Cuff Size: Large)   Temp 98.3 F (36.8 C) (Oral)   Ht 5\' 9"  (1.753 m)   Wt 203 lb (92.1 kg)   BMI 29.98 kg/m  Examination HEENT were negative neck was supple thyroid is not enlarged no carotid bruits cardiopulmonary exam normal abdominal exam normal genitalia normal circumcised male rectum normal stool guaiac-negative prostate normal extremities normal skin normal peripheral pulses normal except for numerous scars reset lesions removed. The biggest is on his mid lower back we had a melanoma excised. He sees dermatology every 6 months.  #1 history of mild depression....... continue Celexa  #2 reflux esophagitis.....Marland Kitchen continue proton X  #3 hyperlipidemia......... continue Pravachol and aspirin check lipid panel  #4 history of occasional HSV-1......Marland Kitchen refill acyclovir  #5 history of melanoma........ thorough skin exam monthly at home follow-up by [facility] every 6 months.

## 2016-10-02 NOTE — Patient Instructions (Signed)
Continue current medications  Walk 30 minutes daily............ carbohydrate free diet  Follow-up in one year sooner if any problems

## 2016-10-02 NOTE — Progress Notes (Signed)
Pre visit review using our clinic review tool, if applicable. No additional management support is needed unless otherwise documented below in the visit note. 

## 2016-10-11 DIAGNOSIS — H5210 Myopia, unspecified eye: Secondary | ICD-10-CM | POA: Diagnosis not present

## 2016-10-11 DIAGNOSIS — H52229 Regular astigmatism, unspecified eye: Secondary | ICD-10-CM | POA: Diagnosis not present

## 2016-10-11 DIAGNOSIS — H524 Presbyopia: Secondary | ICD-10-CM | POA: Diagnosis not present

## 2016-11-04 MED FILL — PRAVASTATIN NA 20 MG TAB: 20 | 30 days supply | Qty: 45 | Fill #1

## 2016-12-06 MED FILL — PRAVASTATIN NA 20 MG TAB: 20 | 30 days supply | Qty: 45 | Fill #2

## 2016-12-27 MED FILL — CITALOPRAM HBR 40 MG TABLET: 40 | 30 days supply | Qty: 30 | Fill #1

## 2016-12-31 DIAGNOSIS — D2262 Melanocytic nevi of left upper limb, including shoulder: Secondary | ICD-10-CM | POA: Diagnosis not present

## 2016-12-31 DIAGNOSIS — D2261 Melanocytic nevi of right upper limb, including shoulder: Secondary | ICD-10-CM | POA: Diagnosis not present

## 2016-12-31 DIAGNOSIS — L821 Other seborrheic keratosis: Secondary | ICD-10-CM | POA: Diagnosis not present

## 2017-01-10 MED FILL — PRAVASTATIN NA 20 MG TAB: 20 | 30 days supply | Qty: 45 | Fill #3

## 2017-02-12 MED FILL — PRAVASTATIN NA 20 MG TAB: 20 | 30 days supply | Qty: 45 | Fill #4

## 2017-02-28 MED FILL — CITALOPRAM HBR 40 MG TABLET: 40 | 30 days supply | Qty: 30 | Fill #2

## 2017-03-13 MED FILL — PRAVASTATIN NA 20 MG TAB: 20 | 30 days supply | Qty: 45 | Fill #5

## 2017-03-28 ENCOUNTER — Encounter: Payer: Self-pay | Admitting: Family Medicine

## 2017-03-31 ENCOUNTER — Other Ambulatory Visit: Payer: Self-pay | Admitting: Family Medicine

## 2017-03-31 DIAGNOSIS — Z0000 Encounter for general adult medical examination without abnormal findings: Secondary | ICD-10-CM

## 2017-04-14 MED FILL — PRAVASTATIN NA 20 MG TAB: 20 | 30 days supply | Qty: 45 | Fill #6

## 2017-05-01 MED FILL — CITALOPRAM HBR 40 MG TABLET: 40 | 30 days supply | Qty: 30 | Fill #3

## 2017-05-16 MED FILL — PRAVASTATIN NA 20 MG TAB: 20 | 30 days supply | Qty: 45 | Fill #7

## 2017-06-23 DIAGNOSIS — D485 Neoplasm of uncertain behavior of skin: Secondary | ICD-10-CM | POA: Diagnosis not present

## 2017-06-23 DIAGNOSIS — D2261 Melanocytic nevi of right upper limb, including shoulder: Secondary | ICD-10-CM | POA: Diagnosis not present

## 2017-06-23 DIAGNOSIS — D225 Melanocytic nevi of trunk: Secondary | ICD-10-CM | POA: Diagnosis not present

## 2017-06-23 DIAGNOSIS — L110 Acquired keratosis follicularis: Secondary | ICD-10-CM | POA: Diagnosis not present

## 2017-06-23 DIAGNOSIS — D2271 Melanocytic nevi of right lower limb, including hip: Secondary | ICD-10-CM | POA: Diagnosis not present

## 2017-06-25 MED FILL — PRAVASTATIN NA 20 MG TAB: 20 | 30 days supply | Qty: 45 | Fill #8

## 2017-06-25 MED FILL — CITALOPRAM HBR 40 MG TABLET: 40 | 30 days supply | Qty: 30 | Fill #4

## 2017-07-28 MED FILL — PRAVASTATIN SODIUM 20 MG TA: 20 | 30 days supply | Qty: 45 | Fill #9

## 2017-08-27 MED FILL — PRAVASTATIN SODIUM 20 MG TA: 20 | 30 days supply | Qty: 45 | Fill #10

## 2017-08-27 MED FILL — CITALOPRAM HBR 40 MG TABLET: 40 | 30 days supply | Qty: 30 | Fill #5

## 2017-09-29 MED FILL — PRAVASTATIN SODIUM 20 MG TA: 20 | 30 days supply | Qty: 45 | Fill #11

## 2017-10-30 ENCOUNTER — Other Ambulatory Visit: Payer: Self-pay | Admitting: Family Medicine

## 2017-10-30 DIAGNOSIS — E785 Hyperlipidemia, unspecified: Secondary | ICD-10-CM

## 2017-10-30 MED FILL — CITALOPRAM HBR 40 MG TABLET: 40 | 30 days supply | Qty: 30 | Fill #0

## 2017-10-30 MED FILL — PRAVASTATIN SODIUM 20 MG TA: 20 | 30 days supply | Qty: 45 | Fill #0

## 2017-11-04 ENCOUNTER — Encounter: Payer: Self-pay | Admitting: Family Medicine

## 2017-11-04 ENCOUNTER — Ambulatory Visit (INDEPENDENT_AMBULATORY_CARE_PROVIDER_SITE_OTHER): Payer: 59 | Admitting: Family Medicine

## 2017-11-04 VITALS — BP 126/84 | HR 68 | Temp 98.3°F | Ht 69.0 in | Wt 202.0 lb

## 2017-11-04 DIAGNOSIS — F410 Panic disorder [episodic paroxysmal anxiety] without agoraphobia: Secondary | ICD-10-CM

## 2017-11-04 DIAGNOSIS — Z0000 Encounter for general adult medical examination without abnormal findings: Secondary | ICD-10-CM

## 2017-11-04 DIAGNOSIS — K219 Gastro-esophageal reflux disease without esophagitis: Secondary | ICD-10-CM

## 2017-11-04 DIAGNOSIS — Z85820 Personal history of malignant melanoma of skin: Secondary | ICD-10-CM

## 2017-11-04 DIAGNOSIS — E785 Hyperlipidemia, unspecified: Secondary | ICD-10-CM

## 2017-11-04 DIAGNOSIS — Z8042 Family history of malignant neoplasm of prostate: Secondary | ICD-10-CM | POA: Diagnosis not present

## 2017-11-04 LAB — BASIC METABOLIC PANEL
BUN: 15 mg/dL (ref 6–23)
CALCIUM: 9.3 mg/dL (ref 8.4–10.5)
CO2: 29 meq/L (ref 19–32)
Chloride: 103 mEq/L (ref 96–112)
Creatinine, Ser: 1.09 mg/dL (ref 0.40–1.50)
GFR: 75.04 mL/min (ref >60.00)
GLUCOSE: 89 mg/dL (ref 70–99)
Potassium: 4.4 mEq/L (ref 3.5–5.1)
Sodium: 139 mEq/L (ref 135–145)

## 2017-11-04 LAB — POCT URINALYSIS DIPSTICK
BILIRUBIN UA: NEGATIVE
GLUCOSE UA: NEGATIVE
KETONES UA: NEGATIVE
Leukocytes, UA: NEGATIVE
Nitrite, UA: NEGATIVE
PH UA: 6 (ref 5.0–8.0)
Protein, UA: NEGATIVE
RBC UA: NEGATIVE
Spec Grav, UA: 1.025 (ref 1.010–1.025)
UROBILINOGEN UA: 0.2 U/dL

## 2017-11-04 LAB — CBC WITH DIFFERENTIAL/PLATELET
BASOS ABS: 0 10*3/uL (ref 0.0–0.1)
Basophils Relative: 0.9 % (ref 0.0–3.0)
EOS PCT: 2.2 % (ref 0.0–5.0)
Eosinophils Absolute: 0.1 10*3/uL (ref 0.0–0.7)
HCT: 43.6 % (ref 39.0–52.0)
Hemoglobin: 14.9 g/dL (ref 13.0–17.0)
LYMPHS ABS: 1.2 10*3/uL (ref 0.7–4.0)
Lymphocytes Relative: 23.9 % (ref 12.0–46.0)
MCHC: 34.2 g/dL (ref 30.0–36.0)
MCV: 88.1 fl (ref 78.0–100.0)
MONOS PCT: 8.4 % (ref 3.0–12.0)
Monocytes Absolute: 0.4 10*3/uL (ref 0.1–1.0)
NEUTROS ABS: 3.3 10*3/uL (ref 1.4–7.7)
NEUTROS PCT: 64.6 % (ref 43.0–77.0)
PLATELETS: 226 10*3/uL (ref 150.0–400.0)
RBC: 4.95 Mil/uL (ref 4.22–5.81)
RDW: 13.5 % (ref 11.5–15.5)
WBC: 5.1 10*3/uL (ref 4.0–10.5)

## 2017-11-04 LAB — HEPATIC FUNCTION PANEL
ALBUMIN: 4.5 g/dL (ref 3.5–5.2)
ALT: 24 U/L (ref 0–53)
AST: 23 U/L (ref 0–37)
Alkaline Phosphatase: 56 U/L (ref 39–117)
Bilirubin, Direct: 0.1 mg/dL (ref 0.0–0.3)
Total Bilirubin: 0.4 mg/dL (ref 0.2–1.2)
Total Protein: 6.7 g/dL (ref 6.0–8.3)

## 2017-11-04 LAB — LIPID PANEL
CHOLESTEROL: 191 mg/dL (ref 0–200)
HDL: 48 mg/dL (ref >39.00)
LDL Cholesterol: 123 mg/dL — ABNORMAL HIGH (ref 0–99)
NonHDL: 143.32
Total CHOL/HDL Ratio: 4
Triglycerides: 102 mg/dL (ref 0.0–149.0)
VLDL: 20.4 mg/dL (ref 0.0–40.0)

## 2017-11-04 LAB — PSA: PSA: 2.63 ng/mL (ref 0.10–4.00)

## 2017-11-04 LAB — TSH: TSH: 1.26 u[IU]/mL (ref 0.35–4.50)

## 2017-11-04 MED ORDER — VALACYCLOVIR HCL 1 G PO TABS: 1000.0000 mg | ORAL_TABLET | Freq: Two times a day (BID) | ORAL | 2 refills | Status: DC

## 2017-11-04 MED ORDER — CITALOPRAM HYDROBROMIDE 40 MG PO TABS: 40.0000 mg | ORAL_TABLET | Freq: Every day | ORAL | 4 refills | Status: DC

## 2017-11-04 MED ORDER — PANTOPRAZOLE SODIUM 40 MG PO TBEC: DELAYED_RELEASE_TABLET | ORAL | 4 refills | Status: DC

## 2017-11-04 MED ORDER — PRAVASTATIN SODIUM 20 MG PO TABS: 30.0000 mg | ORAL_TABLET | Freq: Every day | ORAL | 4 refills | Status: DC

## 2017-11-04 NOTE — Patient Instructions (Signed)
Labs today............. I will call your physician anything abnormal  If you have not done already please set up for my chart. If you have any issues or questions contact us on my chart since it comes directly to [place]  Because of the hiatal hernia I would take the proton X daily.  Follow-up in one year sooner if any problems

## 2017-11-04 NOTE — Progress Notes (Signed)
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 We'll check lipid panel today  He takes Celexa 40 mg daily because of a history of mild panic disorder.  He takes Protonix 40 mg when necessary for reflux esophagitis  He takes Valtrex 1000 mg twice a day when necessary for recurrence of HSV-1  He gets routine eye care, dental care, colonoscopy 2015 showed some polyps. He's due to go back in 2020 for follow-up.  Family history changed brother was recently diagnosed with prostate cancer at age 73.  Vaccinations up-to-date  Social history...........Marland Kitchen married for the second time. He in his wife live here in [place] She is an executive is [employer]. By now works for [employer] in [place]. One son from his first marriage is 33 years of age works in Biomedical scientist. [name] walks quite a bit with his job over 3 miles a day  14 point review of systems otherwise negative except for above  BP 126/84 (BP Location: Right Arm, Patient Position: Sitting, Cuff Size: Large)   Pulse 68   Temp 98.3 F (36.8 C) (Oral)   Ht 5\' 9"  (1.753 m)   Wt 202 lb (91.6 kg)   BMI 29.83 kg/m  Well-developed well-nourished male no acute distress vital signs stable he is afebrile HEENT were negative neck was supple thyroid is not enlarged cardiopulmonary exam normal. Abdominal exam normal. Genitalia normal circumcised male. Rectal normal stool guaiac-negative prostate normal extremities normal skin normal peripheral pulses normal,,,,,,,,,,,,,,,, total body skin exam done because of a history of melanoma on his back. He has a scar lower back from previous melanotic excision also numerous scars from other lesions were removed. Parenthetically  he sees his dermatologist every 3-6 months)  #1 hyperlipidemia........ check labs  #2 reflux esophagitis.......... continue proton X  #3 history of panic attack...........Marland Kitchen well control with Celexa........ continue Celexa  #4 family history of prostate cancer......... careful follow-up urine your PSA and DRE  #5 history of melanoma............ follow-up by dermatologist.

## 2017-11-06 ENCOUNTER — Encounter: Payer: Self-pay | Admitting: Family Medicine

## 2017-11-20 MED FILL — PANTOPRAZOLE SOD DR 40 MG T: 40 | 30 days supply | Qty: 30 | Fill #0

## 2017-11-20 MED FILL — valACYclovir HCL 1 GM TABS: 1 | 15 days supply | Qty: 30 | Fill #0

## 2017-11-25 DIAGNOSIS — H524 Presbyopia: Secondary | ICD-10-CM | POA: Diagnosis not present

## 2017-11-25 DIAGNOSIS — H538 Other visual disturbances: Secondary | ICD-10-CM | POA: Diagnosis not present

## 2017-11-25 DIAGNOSIS — B078 Other viral warts: Secondary | ICD-10-CM | POA: Diagnosis not present

## 2017-11-26 DIAGNOSIS — H029 Unspecified disorder of eyelid: Secondary | ICD-10-CM | POA: Diagnosis not present

## 2017-11-26 DIAGNOSIS — B078 Other viral warts: Secondary | ICD-10-CM | POA: Diagnosis not present

## 2017-11-26 MED FILL — NEO/POLY/DEXAMET EYE OINT: 3.5-10000-0 | 10 days supply | Qty: 4 | Fill #0

## 2017-12-10 MED FILL — PRAVASTATIN NA 20 MG TAB: 20 | 30 days supply | Qty: 45 | Fill #0 | Status: TO

## 2017-12-22 DIAGNOSIS — L821 Other seborrheic keratosis: Secondary | ICD-10-CM | POA: Diagnosis not present

## 2017-12-22 DIAGNOSIS — L570 Actinic keratosis: Secondary | ICD-10-CM | POA: Diagnosis not present

## 2017-12-22 DIAGNOSIS — D2261 Melanocytic nevi of right upper limb, including shoulder: Secondary | ICD-10-CM | POA: Diagnosis not present

## 2017-12-22 DIAGNOSIS — D2262 Melanocytic nevi of left upper limb, including shoulder: Secondary | ICD-10-CM | POA: Diagnosis not present

## 2017-12-24 MED FILL — CITALOPRAM HBR 40 MG TABLET: 40 | 30 days supply | Qty: 30 | Fill #0

## 2018-01-07 MED FILL — PRAVASTATIN SODIUM 20 MG TA: 20 | 30 days supply | Qty: 45 | Fill #0

## 2018-02-09 MED FILL — PRAVASTATIN SODIUM 20 MG TA: 20 | 30 days supply | Qty: 45 | Fill #1

## 2018-03-02 MED FILL — CITALOPRAM HBR 40 MG TABLET: 40 | 30 days supply | Qty: 30 | Fill #1

## 2018-03-03 DIAGNOSIS — L821 Other seborrheic keratosis: Secondary | ICD-10-CM | POA: Diagnosis not present

## 2018-03-13 MED FILL — PRAVASTATIN SODIUM 20 MG TA: 20 | 30 days supply | Qty: 45 | Fill #2

## 2018-03-13 MED FILL — valACYclovir HCL 1 GM TABS: 1 | 15 days supply | Qty: 30 | Fill #1

## 2018-04-14 MED FILL — PANTOPRAZOLE SOD DR 40 MG T: 40 | 30 days supply | Qty: 30 | Fill #1

## 2018-04-14 MED FILL — PRAVASTATIN SODIUM 20 MG TA: 20 | 30 days supply | Qty: 45 | Fill #3

## 2018-04-29 MED FILL — CITALOPRAM HBR 40 MG TABLET: 40 | 30 days supply | Qty: 30 | Fill #2

## 2018-05-13 MED FILL — PRAVASTATIN SODIUM 20 MG TA: 20 | 30 days supply | Qty: 45 | Fill #4

## 2018-06-22 DIAGNOSIS — D2262 Melanocytic nevi of left upper limb, including shoulder: Secondary | ICD-10-CM | POA: Diagnosis not present

## 2018-06-22 DIAGNOSIS — D225 Melanocytic nevi of trunk: Secondary | ICD-10-CM | POA: Diagnosis not present

## 2018-06-22 DIAGNOSIS — L821 Other seborrheic keratosis: Secondary | ICD-10-CM | POA: Diagnosis not present

## 2018-06-22 MED FILL — PRAVASTATIN SODIUM 20 MG TA: 20 | 30 days supply | Qty: 45 | Fill #5

## 2018-06-26 MED FILL — CITALOPRAM HBR 40 MG TABLET: 40 | 30 days supply | Qty: 30 | Fill #3

## 2018-07-08 HISTORY — PX: OTHER SURGICAL HISTORY: SHX169

## 2018-08-14 ENCOUNTER — Ambulatory Visit: Payer: 59 | Admitting: Family Medicine

## 2018-08-14 ENCOUNTER — Encounter: Payer: Self-pay | Admitting: Family Medicine

## 2018-08-14 VITALS — BP 120/84 | HR 84 | Temp 98.3°F | Resp 12 | Ht 69.0 in | Wt 209.4 lb

## 2018-08-14 DIAGNOSIS — Z20828 Contact with and (suspected) exposure to other viral communicable diseases: Secondary | ICD-10-CM | POA: Diagnosis not present

## 2018-08-14 DIAGNOSIS — R05 Cough: Secondary | ICD-10-CM

## 2018-08-14 DIAGNOSIS — J069 Acute upper respiratory infection, unspecified: Secondary | ICD-10-CM | POA: Diagnosis not present

## 2018-08-14 DIAGNOSIS — R059 Cough, unspecified: Secondary | ICD-10-CM

## 2018-08-14 LAB — POCT INFLUENZA A/B
INFLUENZA B, POC: NEGATIVE
Influenza A, POC: NEGATIVE

## 2018-08-14 MED ORDER — BENZONATATE 100 MG PO CAPS: 200.0000 mg | ORAL_CAPSULE | Freq: Two times a day (BID) | ORAL | 0 refills | Status: AC | PRN

## 2018-08-14 MED ORDER — OSELTAMIVIR PHOSPHATE 75 MG PO CAPS: 75.0000 mg | ORAL_CAPSULE | Freq: Two times a day (BID) | ORAL | 0 refills | Status: AC

## 2018-08-14 MED FILL — OSELTAMIVIR PHOSPHATE 75 MG: 75 | 5 days supply | Qty: 10 | Fill #0

## 2018-08-14 MED FILL — BENZONATATE 100 MG CAP: 100 | 10 days supply | Qty: 40 | Fill #0

## 2018-08-14 NOTE — Patient Instructions (Addendum)
A few things to remember from today's visit:   Exposure to influenza - Plan: oseltamivir (TAMIFLU) 75 MG capsule  URI, acute  Cough - Plan: benzonatate (TESSALON) 100 MG capsule  viral infections are self-limited and we treat each symptom depending of severity.  Over the counter medications as decongestants and cold medications usually help, they need to be taken with caution if there is a history of high blood pressure or palpitations. Tylenol and/or Ibuprofen also helps with most symptoms (headache, muscle aching, fever,etc) Plenty of fluids. Honey helps with cough. Steam inhalations helps with runny nose, nasal congestion, and may prevent sinus infections. Cough and nasal congestion could last a few days and sometimes weeks. Please follow in not any better in 1-2 weeks or if symptoms get worse.  Please be sure medication list is accurate. If a new problem present, please set up appointment sooner than planned today.

## 2018-08-14 NOTE — Progress Notes (Signed)
ACUTE VISIT  HPI:  Chief Complaint  Patient presents with  . Cough    sx started yesterday  . Nasal Congestion    Gary [name] is a 53 y.o.male here today complaining of a day of respiratory symptoms.  Non productive cough,nasal congestion,rhinorrhea,and post nasal drainage.  Negative for fever,he has had chills and body aches. Denies dyspnea,wheezing,or chest pain.  URI   This is a new problem. The current episode started yesterday. The problem has been gradually worsening. There has been no fever. Associated symptoms include congestion, coughing and rhinorrhea. Pertinent negatives include no abdominal pain, chest pain, diarrhea, ear pain, headaches, joint pain, joint swelling, nausea, neck pain, plugged ear sensation, rash, sneezing, sore throat, swollen glands, vomiting or wheezing. He has tried nothing for the symptoms.    No Hx of recent travel. Wife recently diagnosed with influenza. No known insect bite.  Hx of allergies: Negative.  OTC medications for this problem: None.   Review of Systems  Constitutional: Positive for fatigue. Negative for activity change, appetite change, chills and fever.  HENT: Positive for congestion, postnasal drip and rhinorrhea. Negative for ear pain, mouth sores, sneezing, sore throat and trouble swallowing.   Eyes: Negative for discharge, redness and itching.  Respiratory: Positive for cough. Negative for chest tightness, shortness of breath and wheezing.   Cardiovascular: Negative.  Negative for chest pain.  Gastrointestinal: Negative for abdominal pain, diarrhea, nausea and vomiting.  Musculoskeletal: Positive for myalgias. Negative for joint pain and neck pain.  Skin: Negative for rash.  Allergic/Immunologic: Positive for environmental allergies.  Neurological: Negative for weakness and headaches.      Current Outpatient Medications on File Prior to Visit  Medication Sig Dispense Refill  . aspirin 81 MG tablet Take  81 mg by mouth daily.      . citalopram (CELEXA) 40 MG tablet Take 1 tablet (40 mg total) by mouth daily. 100 tablet 4  . pantoprazole (PROTONIX) 40 MG tablet take 1 tablet by mouth once daily 90 tablet 4  . pravastatin (PRAVACHOL) 20 MG tablet Take 1.5 tablets (30 mg total) by mouth daily. 100 tablet 4  . valACYclovir (VALTREX) 1000 MG tablet Take 1 tablet (1,000 mg total) by mouth 2 (two) times daily. 60 tablet 2   No current facility-administered medications on file prior to visit.      Past Medical History:  Diagnosis Date  . Allergy   . Anxiety   . Hiatal hernia 2013   egd  . Hyperlipidemia    No Known Allergies  Social History   Socioeconomic History  . Marital status: Married    Spouse name: Not on file  . Number of children: 1  . Years of education: Not on file  . Highest education level: Not on file  Occupational History  . Occupation: Chemical engineer: UNEMPLOYED  Social Needs  . Financial resource strain: Not on file  . Food insecurity:    Worry: Not on file    Inability: Not on file  . Transportation needs:    Medical: Not on file    Non-medical: Not on file  Tobacco Use  . Smoking status: Former Research scientist (life sciences)  . Smokeless tobacco: Never Used  Substance and Sexual Activity  . Alcohol use: Yes    Comment: Drinks a beer once in awhile.  Not often  . Drug use: No  . Sexual activity: Not on file  Lifestyle  . Physical activity:  Days per week: Not on file    Minutes per session: Not on file  . Stress: Not on file  Relationships  . Social connections:    Talks on phone: Not on file    Gets together: Not on file    Attends religious service: Not on file    Active member of club or organization: Not on file    Attends meetings of clubs or organizations: Not on file    Relationship status: Not on file  Other Topics Concern  . Not on file  Social History Narrative  . Not on file    Vitals:   08/14/18 1044  BP: 120/84  Pulse: 84  Resp: 12    Temp: 98.3 F (36.8 C)  SpO2: 97%   Body mass index is 30.92 kg/m.    Physical Exam  Nursing note and vitals reviewed. Constitutional: He is oriented to person, place, and time. He appears well-developed. He does not appear ill. No distress.  HENT:  Head: Normocephalic and atraumatic.  Nose: Rhinorrhea present. Right sinus exhibits no maxillary sinus tenderness and no frontal sinus tenderness. Left sinus exhibits no maxillary sinus tenderness and no frontal sinus tenderness.  Mouth/Throat: Oropharynx is clear and moist and mucous membranes are normal.  Postnasal drainage.  Eyes: Conjunctivae are normal.  Cardiovascular: Normal rate and regular rhythm.  No murmur heard. Respiratory: Effort normal and breath sounds normal. No respiratory distress.  Lymphadenopathy:       Head (right side): No submandibular adenopathy present.       Head (left side): No submandibular adenopathy present.    He has no cervical adenopathy.  Neurological: He is alert and oriented to person, place, and time. He has normal strength.  Skin: Skin is warm. No rash noted. No erythema.  Psychiatric: He has a normal mood and affect. His speech is normal.  Well groomed, good eye contact.     ASSESSMENT AND PLAN:   Mr. Vinicius was seen today for cough and nasal congestion.  Diagnoses and all orders for this visit:  URI, acute Most likely viral and given his exposure to influenza + flu like symptoms Tamiflu recommended. Plenty of po fluids,rest, rest,and monitor for new symptoms.  Exposure to influenza Rapid flu negative.  -     oseltamivir (TAMIFLU) 75 MG capsule; Take 1 capsule (75 mg total) by mouth 2 (two) times daily for 5 days. -     POC Influenza A/B  Cough Explained that cough can last a few weeks. Lung auscultation negative,I do not think imaging is needed. Instructed about warning signs.  -     benzonatate (TESSALON) 100 MG capsule; Take 2 capsules (200 mg total) by mouth 2 (two) times  daily as needed for up to 10 days. -     POC Influenza A/B    Return if symptoms worsen or fail to improve.      Betty G. Martinique, MD  [facility] [place] office.

## 2018-08-26 MED FILL — CITALOPRAM HBR 40 MG TABLET: 40 | 30 days supply | Qty: 30 | Fill #0 | Status: TO

## 2018-08-26 MED FILL — PRAVASTATIN SODIUM 20 MG TA: 20 | 30 days supply | Qty: 45 | Fill #0 | Status: TO

## 2018-08-26 MED FILL — PANTOPRAZOLE SOD DR 40 MG T: 40 | 30 days supply | Qty: 30 | Fill #0 | Status: TO

## 2018-09-02 DIAGNOSIS — M7661 Achilles tendinitis, right leg: Secondary | ICD-10-CM | POA: Diagnosis not present

## 2018-09-02 DIAGNOSIS — M722 Plantar fascial fibromatosis: Secondary | ICD-10-CM | POA: Diagnosis not present

## 2018-09-16 DIAGNOSIS — M722 Plantar fascial fibromatosis: Secondary | ICD-10-CM | POA: Diagnosis not present

## 2018-09-25 MED FILL — CITALOPRAM HBR 40 MG TABLET: 40 | 30 days supply | Qty: 30 | Fill #0

## 2018-09-25 MED FILL — PANTOPRAZOLE SOD DR 40 MG T: 40 | 30 days supply | Qty: 30 | Fill #0

## 2018-09-25 MED FILL — PRAVASTATIN SODIUM 20 MG TA: 20 | 30 days supply | Qty: 45 | Fill #0

## 2018-10-14 DIAGNOSIS — M722 Plantar fascial fibromatosis: Secondary | ICD-10-CM | POA: Diagnosis not present

## 2018-10-14 DIAGNOSIS — M7661 Achilles tendinitis, right leg: Secondary | ICD-10-CM | POA: Diagnosis not present

## 2018-10-22 ENCOUNTER — Telehealth: Payer: Self-pay | Admitting: *Deleted

## 2018-10-22 NOTE — Telephone Encounter (Signed)
Patient would like to transfer to Dr Martinique.

## 2018-10-22 NOTE — Telephone Encounter (Signed)
Attempted to leave a message for patient to schedule a TOC CRM

## 2018-10-22 NOTE — Telephone Encounter (Signed)
Patient is returning call to office.  Tried to reach office, but line was constantly busy.  Please call patient back at [phone]

## 2018-10-22 NOTE — Telephone Encounter (Signed)
Message sent to Dr. Jordan for review and approval. 

## 2018-10-23 NOTE — Telephone Encounter (Signed)
TOC appointment scheduled for 10/26/2018.

## 2018-10-23 NOTE — Telephone Encounter (Signed)
Fine with me. Thanks, BJ 

## 2018-10-26 ENCOUNTER — Encounter: Payer: Self-pay | Admitting: Family Medicine

## 2018-10-26 ENCOUNTER — Other Ambulatory Visit: Payer: Self-pay

## 2018-10-26 ENCOUNTER — Ambulatory Visit (INDEPENDENT_AMBULATORY_CARE_PROVIDER_SITE_OTHER): Payer: 59 | Admitting: Family Medicine

## 2018-10-26 VITALS — HR 80 | Resp 12

## 2018-10-26 DIAGNOSIS — E785 Hyperlipidemia, unspecified: Secondary | ICD-10-CM

## 2018-10-26 DIAGNOSIS — K219 Gastro-esophageal reflux disease without esophagitis: Secondary | ICD-10-CM | POA: Diagnosis not present

## 2018-10-26 DIAGNOSIS — E669 Obesity, unspecified: Secondary | ICD-10-CM | POA: Diagnosis not present

## 2018-10-26 DIAGNOSIS — F410 Panic disorder [episodic paroxysmal anxiety] without agoraphobia: Secondary | ICD-10-CM

## 2018-10-26 MED ORDER — PRAVASTATIN SODIUM 20 MG PO TABS: 30.0000 mg | ORAL_TABLET | Freq: Every day | ORAL | 1 refills | Status: DC

## 2018-10-26 MED ORDER — CITALOPRAM HYDROBROMIDE 20 MG PO TABS: 20.0000 mg | ORAL_TABLET | Freq: Every day | ORAL | 0 refills | Status: DC

## 2018-10-26 MED ORDER — PANTOPRAZOLE SODIUM 40 MG PO TBEC: DELAYED_RELEASE_TABLET | ORAL | 2 refills | Status: DC

## 2018-10-26 MED FILL — CITALOPRAM HBR 40 MG TABLET: 40 | 30 days supply | Qty: 30 | Fill #1

## 2018-10-26 NOTE — Assessment & Plan Note (Signed)
We discussed benefits of wt loss as well as adverse effects of obesity. Consistency with healthy diet and physical activity recommended.  

## 2018-10-26 NOTE — Assessment & Plan Note (Signed)
Problem has been well controlled for about 10 years. She agrees with decreasing dose of Celexa from 20 mg to 20 and 10 mg hyphenated every other day. We will reevaluate in 3 months and if problem still is stable we can decrease dose to 10 mg and continue weaning medication off. Instructed about warning signs.

## 2018-10-26 NOTE — Progress Notes (Signed)
Virtual Visit via Video Note   I connected with Gary [name] on 10/26/18 at 10:00 AM EDT by a video enabled telemedicine application and verified that I am speaking with the correct person using two identifiers.  Location patient: home Location provider:home office Persons participating in the virtual visit: patient, provider  I discussed the limitations of evaluation and management by telemedicine and the availability of in person appointments. The patient expressed understanding and agreed to proceed.   HPI: Gary [name] is a 53 year old male with history of hiatal hernia, anxiety, and hyperlipidemia among some. I last saw him for acute visit on 08/14/2018. Today I am seeing him for transfer of care. Former PCP: Dr Sherren Mocha.  GERD: Occasionally he has heartburn associated with certain foods but has improved greatly when he started taking Protonix 40 mg daily. Denies abdominal pain, nausea, vomiting, changes in bowel habits, blood in stool or melena.  Hyperlipidemia: Currently he is on pravastatin 20 mg daily. He has tolerated medication well. He has not been consistent with low-fat diet.  Lab Results  Component Value Date   CHOL 191 11/04/2017   HDL 48.00 11/04/2017   LDLCALC 123 (H) 11/04/2017   LDLDIRECT 118.0 09/26/2016   TRIG 102.0 11/04/2017   CHOLHDL 4 11/04/2017   He has not been exercising regularly.  Anxiety/panic attacks: Dx about 12 years ago when she was going to a custody battle. He is on Celexa,started at 40 mg but he has been taking 20 mg for 10 years. He tried to stop it but felt bad. He denies depressed mood or suicidal thoughts.  ROS: See pertinent positives and negatives per HPI. COVID-19 screening questions: Denies new fever,cough,sore throat,or possible exposure to COVID-19. Negative for loss in the sense of smell or taste.  Past Medical History:  Diagnosis Date  . Allergy   . Anxiety   . Hiatal hernia 2013   egd  . Hyperlipidemia     Past Surgical  History:  Procedure Laterality Date  . MOHS SURGERY  november 2013   melanoma removed from back  . TONSILLECTOMY    . UPPER GASTROINTESTINAL ENDOSCOPY  2013   hh    Family History  Problem Relation Age of Onset  . Breast cancer Mother   . Uterine cancer Mother   . Leukemia Father   . Arthritis Other   . Hypertension Other   . Thyroid disease Other   . Uterine cancer Other   . Heart disease Other   . Crohn's disease Maternal Grandmother   . Colon polyps Brother   . Colon cancer Other        great maternal grandfather   . Colon cancer Other        great uncle   . Esophageal cancer Neg Hx   . Rectal cancer Neg Hx   . Stomach cancer Neg Hx     Social History   Socioeconomic History  . Marital status: Married    Spouse name: Not on file  . Number of children: 1  . Years of education: Not on file  . Highest education level: Not on file  Occupational History  . Occupation: Chemical engineer: UNEMPLOYED  Social Needs  . Financial resource strain: Not on file  . Food insecurity:    Worry: Not on file    Inability: Not on file  . Transportation needs:    Medical: Not on file    Non-medical: Not on file  Tobacco Use  . Smoking status:  Former Smoker  . Smokeless tobacco: Never Used  Substance and Sexual Activity  . Alcohol use: Yes    Comment: Drinks a beer once in awhile.  Not often  . Drug use: No  . Sexual activity: Not on file  Lifestyle  . Physical activity:    Days per week: Not on file    Minutes per session: Not on file  . Stress: Not on file  Relationships  . Social connections:    Talks on phone: Not on file    Gets together: Not on file    Attends religious service: Not on file    Active member of club or organization: Not on file    Attends meetings of clubs or organizations: Not on file    Relationship status: Not on file  . Intimate partner violence:    Fear of current or ex partner: Not on file    Emotionally abused: Not on file     Physically abused: Not on file    Forced sexual activity: Not on file  Other Topics Concern  . Not on file  Social History Narrative  . Not on file      Current Outpatient Medications:  .  aspirin 81 MG tablet, Take 81 mg by mouth daily.  , Disp: , Rfl:  .  citalopram (CELEXA) 20 MG tablet, Take 1 tablet (20 mg total) by mouth daily., Disp: 90 tablet, Rfl: 0 .  pantoprazole (PROTONIX) 40 MG tablet, take 1 tablet by mouth once daily, Disp: 90 tablet, Rfl: 2 .  pravastatin (PRAVACHOL) 20 MG tablet, Take 1.5 tablets (30 mg total) by mouth daily., Disp: 90 tablet, Rfl: 1 .  valACYclovir (VALTREX) 1000 MG tablet, Take 1 tablet (1,000 mg total) by mouth 2 (two) times daily., Disp: 60 tablet, Rfl: 2  EXAM:  VITALS per patient if applicable:Pulse 80   Resp 12   GENERAL: alert, oriented, appears well and in no acute distress  HEENT: atraumatic, conjunttiva clear, no obvious facial abnormalities on inspection.  NECK: normal movements of the head and neck  LUNGS: on inspection no signs of respiratory distress, breathing rate appears normal, no obvious gross SOB, gasping or wheezing  CV: no obvious cyanosis  MS: moves all visible extremities without noticeable abnormality  PSYCH/NEURO: pleasant and cooperative, no obvious depression or anxiety, speech and thought processing grossly intact  ASSESSMENT AND PLAN:  Discussed the following assessment and plan:  PANIC ATTACK Problem has been well controlled for about 10 years. She agrees with decreasing dose of Celexa from 20 mg to 20 and 10 mg hyphenated every other day. We will reevaluate in 3 months and if problem still is stable we can decrease dose to 10 mg and continue weaning medication off. Instructed about warning signs.  Hyperlipidemia, mild Low-salt diet recommended. No changes in pravastatin 20 mg daily. We will plan on checking lipid panel in 3 months, during CPE visit.   Gastroesophageal reflux disease Problem is well  controlled with Protonix 40 mg daily. No changes in current management. GERD precautions also recommended.  Obesity with body mass index (BMI) of 30.0 to 39.9 We discussed benefits of wt loss as well as adverse effects of obesity. Consistency with healthy diet and physical activity recommended.     I discussed the assessment and treatment plan with the patient. He was provided an opportunity to ask questions and all were answered. He agreed with the plan and demonstrated an understanding of the instructions.     Return  in about 3 months (around 01/25/2019) for cpe+ f/u.    Betty Martinique, MD

## 2018-10-26 NOTE — Assessment & Plan Note (Signed)
Low-salt diet recommended. No changes in pravastatin 20 mg daily. We will plan on checking lipid panel in 3 months, during CPE visit.

## 2018-10-26 NOTE — Assessment & Plan Note (Signed)
Problem is well controlled with Protonix 40 mg daily. No changes in current management. GERD precautions also recommended.

## 2018-10-27 MED FILL — PRAVASTATIN SODIUM 20 MG TA: 20 | 30 days supply | Qty: 45 | Fill #0

## 2018-10-27 MED FILL — CITALOPRAM HBR 20 MG TABLET: 20 | 30 days supply | Qty: 30 | Fill #0

## 2018-10-27 MED FILL — PANTOPRAZOLE SOD DR 40 MG T: 40 | 30 days supply | Qty: 30 | Fill #0

## 2018-11-10 DIAGNOSIS — M722 Plantar fascial fibromatosis: Secondary | ICD-10-CM | POA: Diagnosis not present

## 2018-11-26 MED FILL — PRAVASTATIN SODIUM 20 MG TA: 20 | 30 days supply | Qty: 45 | Fill #1

## 2018-12-24 MED FILL — PRAVASTATIN SODIUM 20 MG TA: 20 | 30 days supply | Qty: 45 | Fill #2

## 2019-01-21 MED FILL — PRAVASTATIN SODIUM 20 MG TA: 20 | 30 days supply | Qty: 45 | Fill #3

## 2019-02-03 ENCOUNTER — Other Ambulatory Visit: Payer: Self-pay | Admitting: Orthopedic Surgery

## 2019-02-03 DIAGNOSIS — M25511 Pain in right shoulder: Secondary | ICD-10-CM

## 2019-02-23 ENCOUNTER — Other Ambulatory Visit: Payer: Self-pay | Admitting: Family Medicine

## 2019-02-23 DIAGNOSIS — E785 Hyperlipidemia, unspecified: Secondary | ICD-10-CM

## 2019-02-23 MED FILL — PRAVASTATIN SODIUM 20 MG TA: 20 | 30 days supply | Qty: 45 | Fill #0

## 2019-03-03 ENCOUNTER — Other Ambulatory Visit: Payer: Self-pay

## 2019-03-03 ENCOUNTER — Ambulatory Visit
Admission: RE | Admit: 2019-03-03 | Discharge: 2019-03-03 | Disposition: A | Discharge status: Home / Self Care | Payer: 59 | Source: Ambulatory Visit | Attending: Orthopedic Surgery | Admitting: Orthopedic Surgery

## 2019-03-03 DIAGNOSIS — M25511 Pain in right shoulder: Secondary | ICD-10-CM

## 2019-03-26 MED FILL — PRAVASTATIN SODIUM 20 MG TA: 20 | 30 days supply | Qty: 45 | Fill #1

## 2019-03-29 MED FILL — oxyCODONE HCL 5 MG TABS: 5 | 5 days supply | Qty: 30 | Fill #0

## 2019-03-29 MED FILL — ONDANSETRON HCL 4 MG TABLET: 4 | 2 days supply | Qty: 10 | Fill #0

## 2019-03-29 MED FILL — MELOXICAM 7.5 MG TABLET: 7.5 | 30 days supply | Qty: 30 | Fill #0

## 2019-04-16 MED FILL — CITALOPRAM HBR 20 MG TABLET: 20 | 30 days supply | Qty: 30 | Fill #1

## 2019-04-19 ENCOUNTER — Other Ambulatory Visit: Payer: Self-pay

## 2019-04-19 ENCOUNTER — Encounter: Payer: Self-pay | Admitting: Family Medicine

## 2019-04-19 ENCOUNTER — Ambulatory Visit (INDEPENDENT_AMBULATORY_CARE_PROVIDER_SITE_OTHER): Payer: 59 | Admitting: Family Medicine

## 2019-04-19 VITALS — BP 120/72 | HR 74 | Temp 97.7°F | Ht 69.0 in | Wt 203.1 lb

## 2019-04-19 DIAGNOSIS — Z0000 Encounter for general adult medical examination without abnormal findings: Secondary | ICD-10-CM | POA: Diagnosis not present

## 2019-04-19 DIAGNOSIS — Z13228 Encounter for screening for other metabolic disorders: Secondary | ICD-10-CM

## 2019-04-19 DIAGNOSIS — Z1329 Encounter for screening for other suspected endocrine disorder: Secondary | ICD-10-CM | POA: Diagnosis not present

## 2019-04-19 DIAGNOSIS — Z1159 Encounter for screening for other viral diseases: Secondary | ICD-10-CM

## 2019-04-19 DIAGNOSIS — E785 Hyperlipidemia, unspecified: Secondary | ICD-10-CM | POA: Diagnosis not present

## 2019-04-19 DIAGNOSIS — K219 Gastro-esophageal reflux disease without esophagitis: Secondary | ICD-10-CM

## 2019-04-19 DIAGNOSIS — Z125 Encounter for screening for malignant neoplasm of prostate: Secondary | ICD-10-CM

## 2019-04-19 DIAGNOSIS — F410 Panic disorder [episodic paroxysmal anxiety] without agoraphobia: Secondary | ICD-10-CM

## 2019-04-19 DIAGNOSIS — Z130 Encounter for screening for diseases of the blood and blood-forming organs and certain disorders involving the immune mechanism: Secondary | ICD-10-CM | POA: Diagnosis not present

## 2019-04-19 DIAGNOSIS — Z9189 Other specified personal risk factors, not elsewhere classified: Secondary | ICD-10-CM

## 2019-04-19 DIAGNOSIS — B001 Herpesviral vesicular dermatitis: Secondary | ICD-10-CM

## 2019-04-19 LAB — COMPREHENSIVE METABOLIC PANEL
ALT: 26 U/L (ref 0–53)
AST: 23 U/L (ref 0–37)
Albumin: 4.4 g/dL (ref 3.5–5.2)
Alkaline Phosphatase: 73 U/L (ref 39–117)
BUN: 16 mg/dL (ref 6–23)
CO2: 27 mEq/L (ref 19–32)
Calcium: 9.5 mg/dL (ref 8.4–10.5)
Chloride: 104 mEq/L (ref 96–112)
Creatinine, Ser: 1.12 mg/dL (ref 0.40–1.50)
GFR: 68.05 mL/min (ref >60.00)
Glucose, Bld: 89 mg/dL (ref 70–99)
Potassium: 4.4 mEq/L (ref 3.5–5.1)
Sodium: 139 mEq/L (ref 135–145)
Total Bilirubin: 0.6 mg/dL (ref 0.2–1.2)
Total Protein: 6.5 g/dL (ref 6.0–8.3)

## 2019-04-19 LAB — LIPID PANEL
Cholesterol: 192 mg/dL (ref 0–200)
HDL: 41.3 mg/dL (ref >39.00)
LDL Cholesterol: 120 mg/dL — ABNORMAL HIGH (ref 0–99)
NonHDL: 150.31
Total CHOL/HDL Ratio: 5
Triglycerides: 153 mg/dL — ABNORMAL HIGH (ref 0.0–149.0)
VLDL: 30.6 mg/dL (ref 0.0–40.0)

## 2019-04-19 LAB — HEMOGLOBIN A1C: Hgb A1c MFr Bld: 6.1 % (ref 4.6–6.5)

## 2019-04-19 LAB — PSA: PSA: 3.47 ng/mL (ref 0.10–4.00)

## 2019-04-19 MED ORDER — CITALOPRAM HYDROBROMIDE 20 MG PO TABS: 20.0000 mg | ORAL_TABLET | Freq: Every day | ORAL | 3 refills | Status: DC

## 2019-04-19 MED ORDER — VALACYCLOVIR HCL 1 G PO TABS: ORAL_TABLET | ORAL | 0 refills | Status: DC

## 2019-04-19 NOTE — Assessment & Plan Note (Signed)
Continue pravastatin 30 mg daily. Low-fat diet is also recommended. Further recommendation will be given according to lipid panel results. Follow-up in a year.

## 2019-04-19 NOTE — Assessment & Plan Note (Signed)
Problem is stable. Continue Valtrex 1 g 2 tablets twice daily x1 day upon acute onset.

## 2019-04-19 NOTE — Progress Notes (Signed)
HPI:  Gary [name] is a 53 y.o.male here today for his routine physical examination.  Last CPE: 06/11/2011. He lives with his wife.  Regular exercise 3 or more times per week: He has not been consistent with regular physical activity for the past 6 to 7 months. Following a healthy diet: Not consistently.   Chronic medical problems: Anxiety, GERD, recurrent fever blisters, and hyperlipidemia.  He underwent right shoulder surgery about 3 weeks ago, following with orthopedist.  Planning on starting PT next week.  Immunization History  Administered Date(s) Administered  . Influenza Split 03/28/2014  . Influenza Whole 04/07/2010  . Influenza-Unspecified 04/11/2015, 02/14/2016  . Td 07/08/1998, 03/01/2009   -Hep C screening: He does not remember having one.  Last colon cancer screening: Colonoscopy on 06/15/2014, 5-year follow-up was recommended.  Last prostate ca screening: On 11/04/2017 PSA was 2.6.  Positive family history for prostate cancer, brother.  -Denies high alcohol intake or Hx of illicit drug use. Former smoker, quit 3 to 4 years ago.  He states that he smoked intermittently and < 1/2 PPD.  -Concerns and/or follow up today:   Hyperlipidemia: Currently he is on pravastatin 30 mg daily.  Lab Results  Component Value Date   CHOL 191 11/04/2017   HDL 48.00 11/04/2017   LDLCALC 123 (H) 11/04/2017   LDLDIRECT 118.0 09/26/2016   TRIG 102.0 11/04/2017   CHOLHDL 4 11/04/2017    GERD: He takes Protonix 40 mg daily.  Recurrent fever blisters, he is on Valtrex to take as needed. Problem exacerbated by stress.  Anxiety: Currently he is on Celexa 20 mg daily. History of panic attacks, started when going through divorce and child custody. He denies depressed mood or suicidal thoughts.   Review of Systems  Constitutional: Negative for activity change, appetite change, fatigue and fever.  HENT: Negative for dental problem, nosebleeds, sore throat and trouble  swallowing.   Eyes: Negative for redness and visual disturbance.  Respiratory: Negative for cough, shortness of breath and wheezing.   Cardiovascular: Negative for chest pain, palpitations and leg swelling.  Gastrointestinal: Negative for abdominal pain, blood in stool, nausea and vomiting.  Endocrine: Negative for cold intolerance, heat intolerance, polydipsia, polyphagia and polyuria.  Genitourinary: Negative for decreased urine volume, discharge, dysuria, genital sores, hematuria and testicular pain.  Musculoskeletal: Negative for gait problem and myalgias. S/P right shoulder surgery. Skin: Negative for color change and rash.  Allergic/Immunologic: Positive for environmental allergies.  Neurological: Negative for syncope, weakness and headaches.  Hematological: Negative for adenopathy. Does not bruise/bleed easily.  Psychiatric/Behavioral: Negative for confusion. The patient is not nervous/anxious.   All other systems reviewed and are negative.  Current Outpatient Medications on File Prior to Visit  Medication Sig Dispense Refill  . aspirin 81 MG tablet Take 81 mg by mouth daily.      . meloxicam (MOBIC) 7.5 MG tablet     . pantoprazole (PROTONIX) 40 MG tablet take 1 tablet by mouth once daily 90 tablet 2  . pravastatin (PRAVACHOL) 20 MG tablet TAKE 1.5 TABLETS (30 MG TOTAL) BY MOUTH DAILY. 90 tablet 1   No current facility-administered medications on file prior to visit.    Past Medical History:  Diagnosis Date  . Allergy   . Anxiety   . Hiatal hernia 2013   egd  . Hyperlipidemia    Past Surgical History:  Procedure Laterality Date  . MOHS SURGERY  november 2013   melanoma removed from back  . TONSILLECTOMY    .  UPPER GASTROINTESTINAL ENDOSCOPY  2013   hh   No Known Allergies  Family History  Problem Relation Age of Onset  . Breast cancer Mother   . Uterine cancer Mother   . Leukemia Father   . Arthritis Other   . Hypertension Other   . Thyroid disease Other   .  Uterine cancer Other   . Heart disease Other   . Crohn's disease Maternal Grandmother   . Colon polyps Brother   . Colon cancer Other        great maternal grandfather   . Colon cancer Other        great uncle   . Esophageal cancer Neg Hx   . Rectal cancer Neg Hx   . Stomach cancer Neg Hx    Social History   Socioeconomic History  . Marital status: Married    Spouse name: Not on file  . Number of children: 1  . Years of education: Not on file  . Highest education level: Not on file  Occupational History  . Occupation: Chemical engineer: UNEMPLOYED  Social Needs  . Financial resource strain: Not on file  . Food insecurity    Worry: Not on file    Inability: Not on file  . Transportation needs    Medical: Not on file    Non-medical: Not on file  Tobacco Use  . Smoking status: Former Research scientist (life sciences)  . Smokeless tobacco: Never Used  Substance and Sexual Activity  . Alcohol use: Yes    Comment: Drinks a beer once in awhile.  Not often  . Drug use: No  . Sexual activity: Not on file  Lifestyle  . Physical activity    Days per week: Not on file    Minutes per session: Not on file  . Stress: Not on file  Relationships  . Social Herbalist on phone: Not on file    Gets together: Not on file    Attends religious service: Not on file    Active member of club or organization: Not on file    Attends meetings of clubs or organizations: Not on file    Relationship status: Not on file  Other Topics Concern  . Not on file  Social History Narrative  . Not on file    Today's Vitals   04/19/19 0809  BP: 120/72  Pulse: 74  Temp: 97.7 F (36.5 C)  TempSrc: Temporal  SpO2: 97%  Weight: 203 lb 2 oz (92.1 kg)  Height: 5\' 9"  (1.753 m)   Body mass index is 30 kg/m.  Wt Readings from Last 3 Encounters:  04/19/19 203 lb 2 oz (92.1 kg)  08/14/18 209 lb 6 oz (95 kg)  11/04/17 202 lb (91.6 kg)     Physical Exam  Nursing note and vitals  reviewed. Constitutional: He is oriented to person, place, and time. He appears well-developed and in no distress.  HENT:  Head: Normocephalic and atraumatic.  Right Ear: Tympanic membrane, external ear and ear canal normal.  Left Ear: Tympanic membrane, external ear and ear canal normal.  Mouth/Throat: Oropharynx is clear and moist and mucous membranes are normal.  Eyes: Pupils are equal, round, and reactive to light. Conjunctivae and EOM are normal.  Neck: Normal range of motion. No tracheal deviation present. No thyromegaly present.  Cardiovascular: Normal rate and regular rhythm.  No murmur heard. Pulses:      Dorsalis pedis pulses are 2+ on the right side  and 2+ on the left side.  Respiratory: Effort normal and breath sounds normal. No respiratory distress.  GI: Soft. He exhibits no mass. There is no hepatomegaly. There is no abdominal tenderness.  Genitourinary:    Genitourinary Comments: Refused,no concerns.   Musculoskeletal:        General: No tenderness or edema.     Comments: No major deformities appreciated and no signs of synovitis. Right shoulder limitation of ROM,recent surgery. Lymphadenopathy:    He has no cervical adenopathy.       Right: No supraclavicular adenopathy present.       Left: No supraclavicular adenopathy present.  Neurological: He is alert and oriented to person, place, and time. He has normal strength. No cranial nerve deficit or sensory deficit. Coordination and gait normal.  Reflex Scores:      Bicep reflexes are 2+ on the right side and 2+ on the left side.      Patellar reflexes are 2+ on the right side and 2+ on the left side. Skin: Skin is warm. No erythema.  Psychiatric: He has a normal mood and affect. Cognition and memory are normal.  Well groomed,good eye contact.   ASSESSMENT AND PLAN: Gary [name] was here today annual physical examination.  Orders Placed This Encounter  Procedures  . Comprehensive metabolic panel  . Lipid panel   . Hemoglobin A1c  . PSA(Must document that pt has been informed of limitations of PSA testing.)  . Hepatitis C antibody   Lab Results  Component Value Date   HGBA1C 6.1 04/19/2019   Lab Results  Component Value Date   CHOL 192 04/19/2019   HDL 41.30 04/19/2019   LDLCALC 120 (H) 04/19/2019   LDLDIRECT 118.0 09/26/2016   TRIG 153.0 (H) 04/19/2019   CHOLHDL 5 04/19/2019   Lab Results  Component Value Date   ALT 26 04/19/2019   AST 23 04/19/2019   ALKPHOS 73 04/19/2019   BILITOT 0.6 04/19/2019   Lab Results  Component Value Date   PSA 3.47 04/19/2019   PSA 2.63 11/04/2017   PSA 2.28 09/26/2016    Routine general medical examination at a health care facility We discussed the importance of regular physical activity and healthy diet for prevention of chronic illness and/or complications. Preventive guidelines reviewed. Vaccination up to date. Current recommendations in regard to aspirin for primary prevention discussed as well as side effects.  Next CPE in a year.  Prostate cancer screening -     PSA(Must document that pt has been informed of limitations of PSA testing.)  Screening for endocrine, metabolic and immunity disorder -     Comprehensive metabolic panel -     Hemoglobin A1c  Encounter for HCV screening test for high risk patient -     Hepatitis C antibody  Hyperlipidemia, mild Continue pravastatin 30 mg daily. Low-fat diet is also recommended. Further recommendation will be given according to lipid panel results. Follow-up in a year.  Gastroesophageal reflux disease Problem is well controlled with Protonix 40 mg daily. GERD precautions to continue. No changes in current management.  PANIC ATTACK Problem is well controlled with Celexa 20 mg daily. No changes to current management.  Recurrent herpes labialis Problem is stable. Continue Valtrex 1 g 2 tablets twice daily x1 day upon acute onset.   Return in 1 year (on 04/18/2020) for CPE and  f/u.    Betty G. Martinique, MD  [facility] ([facility] [place] office.

## 2019-04-19 NOTE — Patient Instructions (Signed)
A few things to remember from today's visit:   Routine general medical examination at a health care facility  Hyperlipidemia, mild - Plan: Lipid panel  Prostate cancer screening - Plan: PSA(Must document that pt has been informed of limitations of PSA testing.)  Screening for endocrine, metabolic and immunity disorder - Plan: Comprehensive metabolic panel, Hemoglobin A1c  Encounter for HCV screening test for high risk patient - Plan: Hepatitis C antibody  At least 150 minutes of moderate exercise per week, daily brisk walking for 15-30 min is a good exercise option. Healthy diet low in saturated (animal) fats and sweets and consisting of fresh fruits and vegetables, lean meats such as fish and white chicken and whole grains.  - Vaccines:  Tdap vaccine every 10 years.  Shingles vaccine recommended at age 10, could be given after 55 years of age but not sure about insurance coverage.  Pneumonia vaccines:  Prevnar 18 at 63 and Pneumovax at 33.   -Screening recommendations for low/normal risk males:  Screening for diabetes at age 65 and every 3 years. Earlier screening if cardiovascular risk factors.  Colon cancer screening at age 41 and until age 68.  Prostate cancer screening: some controversy, starts usually at 20: Rectal exam and PSA.  Aortic Abdominal Aneurism once between 37 and 63 years old if ever smoker.  Also recommended:  1. Dental visit- Brush and floss your teeth twice daily; visit your dentist twice a year. 2. Eye doctor- Get an eye exam at least every 2 years. 3. Helmet use- Always wear a helmet when riding a bicycle, motorcycle, rollerblading or skateboarding. 4. Safe sex- If you may be exposed to sexually transmitted infections, use a condom. 5. Seat belts- Seat belts can save your live; always wear one. 6. Smoke/Carbon Monoxide detectors- These detectors need to be installed on the appropriate level of your home. Replace batteries at least once a year. 7. Skin  cancer- When out in the sun please cover up and use sunscreen 15 SPF or higher. 8. Violence- If anyone is threatening or hurting you, please tell your healthcare provider.  9. Drink alcohol in moderation- Limit alcohol intake to one drink or less per day. Never drink and drive.   Please be sure medication list is accurate. If a new problem present, please set up appointment sooner than planned today.

## 2019-04-19 NOTE — Assessment & Plan Note (Signed)
Problem is well controlled with Protonix 40 mg daily. GERD precautions to continue. No changes in current management.

## 2019-04-19 NOTE — Assessment & Plan Note (Signed)
Problem is well controlled with Celexa 20 mg daily. No changes to current management.

## 2019-04-20 ENCOUNTER — Encounter: Payer: Self-pay | Admitting: Family Medicine

## 2019-04-20 LAB — HEPATITIS C ANTIBODY
Hepatitis C Ab: NONREACTIVE
SIGNAL TO CUT-OFF: 0.01 (ref <1.00)

## 2019-04-20 MED ORDER — PRAVASTATIN SODIUM 20 MG PO TABS: 30.0000 mg | ORAL_TABLET | Freq: Every day | ORAL | 3 refills | Status: DC

## 2019-04-20 MED FILL — PRAVASTATIN SODIUM 20 MG TA: 20 | 30 days supply | Qty: 45 | Fill #0

## 2019-05-14 ENCOUNTER — Encounter: Payer: Self-pay | Admitting: Internal Medicine

## 2019-05-24 MED FILL — PRAVASTATIN SODIUM 20 MG TA: 20 | 30 days supply | Qty: 45 | Fill #1

## 2019-06-09 ENCOUNTER — Other Ambulatory Visit: Payer: Self-pay

## 2019-06-09 ENCOUNTER — Ambulatory Visit (AMBULATORY_SURGERY_CENTER): Payer: 59 | Admitting: *Deleted

## 2019-06-09 ENCOUNTER — Encounter: Payer: Self-pay | Admitting: Internal Medicine

## 2019-06-09 VITALS — Temp 96.6°F | Ht 69.0 in | Wt 205.0 lb

## 2019-06-09 DIAGNOSIS — Z86010 Personal history of colonic polyps: Secondary | ICD-10-CM

## 2019-06-09 DIAGNOSIS — Z1159 Encounter for screening for other viral diseases: Secondary | ICD-10-CM

## 2019-06-09 MED ORDER — NA SULFATE-K SULFATE-MG SULF 17.5-3.13-1.6 GM/177ML PO SOLN: 1.0000 | Freq: Once | ORAL | 0 refills | Status: AC

## 2019-06-09 MED FILL — SUPREP BOWEL PREP KIT: 17.5-3.13-1 | 1 days supply | Qty: 354 | Fill #0

## 2019-06-09 NOTE — Progress Notes (Signed)

## 2019-06-15 MED FILL — CITALOPRAM HBR 20 MG TABLET: 20 | 30 days supply | Qty: 30 | Fill #2

## 2019-06-18 ENCOUNTER — Ambulatory Visit: Payer: 59

## 2019-06-18 ENCOUNTER — Other Ambulatory Visit: Payer: Self-pay | Admitting: Internal Medicine

## 2019-06-18 DIAGNOSIS — Z1159 Encounter for screening for other viral diseases: Secondary | ICD-10-CM

## 2019-06-19 LAB — SARS CORONAVIRUS 2 (TAT 6-24 HRS): SARS Coronavirus 2: NEGATIVE

## 2019-06-22 MED FILL — PRAVASTATIN SODIUM 20 MG TA: 20 | 30 days supply | Qty: 45 | Fill #2

## 2019-06-23 ENCOUNTER — Ambulatory Visit (AMBULATORY_SURGERY_CENTER): Payer: 59 | Admitting: Internal Medicine

## 2019-06-23 ENCOUNTER — Other Ambulatory Visit: Payer: Self-pay

## 2019-06-23 ENCOUNTER — Encounter: Payer: Self-pay | Admitting: Internal Medicine

## 2019-06-23 VITALS — BP 110/67 | HR 68 | Temp 98.2°F | Resp 15 | Ht 69.0 in | Wt 200.0 lb

## 2019-06-23 DIAGNOSIS — D120 Benign neoplasm of cecum: Secondary | ICD-10-CM

## 2019-06-23 DIAGNOSIS — Z86010 Personal history of colonic polyps: Secondary | ICD-10-CM | POA: Diagnosis present

## 2019-06-23 MED ORDER — SODIUM CHLORIDE 0.9 % IV SOLN: 500.0000 mL | Freq: Once | INTRAVENOUS | Status: DC

## 2019-06-23 NOTE — Op Note (Signed)
[facility] Patient Name: Gary [name] Procedure Date: 06/23/2019 1:38 PM MRN: [id number] Endoscopist: Jerene Bears , MD Age: 53 Referring MD:  Date of Birth: [birth year] Gender: Male Account #: [id number] Procedure:                Colonoscopy Indications:              Surveillance: Personal history of adenomatous                            polyps on last colonoscopy 5 years ago, family                            history of colon cancer in multiple second degree                            relatives and family history of colon polyp in                            siblings Medicines:                Monitored Anesthesia Care Procedure:                Pre-Anesthesia Assessment:                           - Prior to the procedure, a History and Physical                            was performed, and patient medications and                            allergies were reviewed. The patient's tolerance of                            previous anesthesia was also reviewed. The risks                            and benefits of the procedure and the sedation                            options and risks were discussed with the patient.                            All questions were answered, and informed consent                            was obtained. Prior Anticoagulants: The patient has                            taken no previous anticoagulant or antiplatelet                            agents. ASA Grade Assessment: II - A patient with  mild systemic disease. After reviewing the risks                            and benefits, the patient was deemed in                            satisfactory condition to undergo the procedure.                           After obtaining informed consent, the colonoscope                            was passed under direct vision. Throughout the                            procedure, the patient's blood pressure, pulse, and            oxygen saturations were monitored continuously. The                            Colonoscope was introduced through the anus and                            advanced to the cecum, identified by appendiceal                            orifice and ileocecal valve. The colonoscopy was                            performed without difficulty. The patient tolerated                            the procedure well. The quality of the bowel                            preparation was good. The ileocecal valve,                            appendiceal orifice, and rectum were photographed. Scope In: 1:41:22 PM Scope Out: 1:55:36 PM Scope Withdrawal Time: 0 hours 12 minutes 24 seconds  Total Procedure Duration: 0 hours 14 minutes 14 seconds  Findings:                 The digital rectal exam was normal.                           A 4 mm polyp was found in the cecum. The polyp was                            sessile. The polyp was removed with a cold snare.                            Resection was complete, but the polyp tissue was  not retrieved.                           Multiple small and large-mouthed diverticula were                            found in the sigmoid colon and descending colon.                           The retroflexed view of the distal rectum and anal                            verge was normal and showed no anal or rectal                            abnormalities. Complications:            No immediate complications. Estimated Blood Loss:     Estimated blood loss: none. Impression:               - One 4 mm polyp in the cecum, removed with a cold                            snare. Complete resection. Polyp tissue not                            retrieved.                           - Diverticulosis in the sigmoid colon and in the                            descending colon.                           - The distal rectum and anal verge are normal on                             retroflexion view. Recommendation:           - Patient has a contact number available for                            emergencies. The signs and symptoms of potential                            delayed complications were discussed with the                            patient. Return to normal activities tomorrow.                            Written discharge instructions were provided to the                            patient.                           -  Resume previous diet.                           - Continue present medications.                           - Repeat colonoscopy in 5 years for surveillance. Jerene Bears, MD 06/23/2019 2:01:46 PM This report has been signed electronically.

## 2019-06-23 NOTE — Patient Instructions (Signed)
Handout on diverticulosis given.   YOU HAD AN ENDOSCOPIC PROCEDURE TODAY AT [facility]:   Refer to the procedure report that was given to you for any specific questions about what was found during the examination.  If the procedure report does not answer your questions, please call your gastroenterologist to clarify.  If you requested that your care partner not be given the details of your procedure findings, then the procedure report has been included in a sealed envelope for you to review at your convenience later.  YOU SHOULD EXPECT: Some feelings of bloating in the abdomen. Passage of more gas than usual.  Walking can help get rid of the air that was put into your GI tract during the procedure and reduce the bloating. If you had a lower endoscopy (such as a colonoscopy or flexible sigmoidoscopy) you may notice spotting of blood in your stool or on the toilet paper. If you underwent a bowel prep for your procedure, you may not have a normal bowel movement for a few days.  Please Note:  You might notice some irritation and congestion in your nose or some drainage.  This is from the oxygen used during your procedure.  There is no need for concern and it should clear up in a day or so.  SYMPTOMS TO REPORT IMMEDIATELY:   Following lower endoscopy (colonoscopy or flexible sigmoidoscopy):  Excessive amounts of blood in the stool  Significant tenderness or worsening of abdominal pains  Swelling of the abdomen that is new, acute  Fever of 100F or higher  For urgent or emergent issues, a gastroenterologist can be reached at any hour by calling [phone].   DIET:  We do recommend a small meal at first, but then you may proceed to your regular diet.  Drink plenty of fluids but you should avoid alcoholic beverages for 24 hours.  ACTIVITY:  You should plan to take it easy for the rest of today and you should NOT DRIVE or use heavy machinery until tomorrow (because of the sedation  medicines used during the test).    FOLLOW UP: Our staff will call the number listed on your records 48-72 hours following your procedure to check on you and address any questions or concerns that you may have regarding the information given to you following your procedure. If we do not reach you, we will leave a message.  We will attempt to reach you two times.  During this call, we will ask if you have developed any symptoms of COVID 19. If you develop any symptoms (ie: fever, flu-like symptoms, shortness of breath, cough etc.) before then, please call [phone].  If you test positive for Covid 19 in the 2 weeks post procedure, please call and report this information to [place].    If any biopsies were taken you will be contacted by phone or by letter within the next 1-3 weeks.  Please call us at [phone] if you have not heard about the biopsies in 3 weeks.    SIGNATURES/CONFIDENTIALITY: You and/or your care partner have signed paperwork which will be entered into your electronic medical record.  These signatures attest to the fact that that the information above on your After Visit Summary has been reviewed and is understood.  Full responsibility of the confidentiality of this discharge information lies with you and/or your care-partner.

## 2019-06-23 NOTE — Progress Notes (Signed)
[id number] [id number]  Pt's states no medical or surgical changes since previsit or office visit.

## 2019-06-23 NOTE — Progress Notes (Signed)
A/ox3, pleased with MAC, report to RN 

## 2019-06-23 NOTE — Progress Notes (Signed)
Called to room to assist during endoscopic procedure.  Patient ID and intended procedure confirmed with present staff. Received instructions for my participation in the procedure from the performing physician.  

## 2019-06-25 ENCOUNTER — Telehealth: Payer: Self-pay

## 2019-06-25 NOTE — Telephone Encounter (Signed)
  Follow up Call-  Call back number 06/23/2019  Post procedure Call Back phone  # [phone]  Permission to leave phone message Yes  Some recent data might be hidden     Patient questions:  Do you have a fever, pain , or abdominal swelling? No. Pain Score  0 *  Have you tolerated food without any problems? Yes.    Have you been able to return to your normal activities? Yes.    Do you have any questions about your discharge instructions: Diet   No. Medications  No. Follow up visit  No.  Do you have questions or concerns about your Care? No.  Actions: * If pain score is 4 or above: No action needed, pain <4.  1. Have you developed a fever since your procedure? no  2.   Have you had an respiratory symptoms (SOB or cough) since your procedure? no  3.   Have you tested positive for COVID 19 since your procedure no  4.   Have you had any family members/close contacts diagnosed with the COVID 19 since your procedure?  no   If yes to any of these questions please route to Joylene John, RN and Alphonsa Gin, Therapist, sports.

## 2019-07-12 DIAGNOSIS — M7541 Impingement syndrome of right shoulder: Secondary | ICD-10-CM | POA: Diagnosis not present

## 2019-07-12 DIAGNOSIS — M6281 Muscle weakness (generalized): Secondary | ICD-10-CM | POA: Diagnosis not present

## 2019-07-12 DIAGNOSIS — M25611 Stiffness of right shoulder, not elsewhere classified: Secondary | ICD-10-CM | POA: Diagnosis not present

## 2019-07-12 DIAGNOSIS — M75121 Complete rotator cuff tear or rupture of right shoulder, not specified as traumatic: Secondary | ICD-10-CM | POA: Diagnosis not present

## 2019-07-14 DIAGNOSIS — M6281 Muscle weakness (generalized): Secondary | ICD-10-CM | POA: Diagnosis not present

## 2019-07-14 DIAGNOSIS — M25611 Stiffness of right shoulder, not elsewhere classified: Secondary | ICD-10-CM | POA: Diagnosis not present

## 2019-07-14 DIAGNOSIS — M75121 Complete rotator cuff tear or rupture of right shoulder, not specified as traumatic: Secondary | ICD-10-CM | POA: Diagnosis not present

## 2019-07-14 DIAGNOSIS — M25511 Pain in right shoulder: Secondary | ICD-10-CM | POA: Diagnosis not present

## 2019-07-19 DIAGNOSIS — M6281 Muscle weakness (generalized): Secondary | ICD-10-CM | POA: Diagnosis not present

## 2019-07-19 DIAGNOSIS — M75121 Complete rotator cuff tear or rupture of right shoulder, not specified as traumatic: Secondary | ICD-10-CM | POA: Diagnosis not present

## 2019-07-19 DIAGNOSIS — M25611 Stiffness of right shoulder, not elsewhere classified: Secondary | ICD-10-CM | POA: Diagnosis not present

## 2019-07-19 DIAGNOSIS — M7541 Impingement syndrome of right shoulder: Secondary | ICD-10-CM | POA: Diagnosis not present

## 2019-07-21 DIAGNOSIS — M25611 Stiffness of right shoulder, not elsewhere classified: Secondary | ICD-10-CM | POA: Diagnosis not present

## 2019-07-21 DIAGNOSIS — M7541 Impingement syndrome of right shoulder: Secondary | ICD-10-CM | POA: Diagnosis not present

## 2019-07-21 DIAGNOSIS — M75121 Complete rotator cuff tear or rupture of right shoulder, not specified as traumatic: Secondary | ICD-10-CM | POA: Diagnosis not present

## 2019-07-21 DIAGNOSIS — M6281 Muscle weakness (generalized): Secondary | ICD-10-CM | POA: Diagnosis not present

## 2019-07-23 MED FILL — PRAVASTATIN NA 20 MG TAB: 20 | 90 days supply | Qty: 135 | Fill #0

## 2019-07-26 DIAGNOSIS — M25611 Stiffness of right shoulder, not elsewhere classified: Secondary | ICD-10-CM | POA: Diagnosis not present

## 2019-07-26 DIAGNOSIS — M6281 Muscle weakness (generalized): Secondary | ICD-10-CM | POA: Diagnosis not present

## 2019-07-26 DIAGNOSIS — M75121 Complete rotator cuff tear or rupture of right shoulder, not specified as traumatic: Secondary | ICD-10-CM | POA: Diagnosis not present

## 2019-07-26 DIAGNOSIS — M7541 Impingement syndrome of right shoulder: Secondary | ICD-10-CM | POA: Diagnosis not present

## 2019-07-28 DIAGNOSIS — M25611 Stiffness of right shoulder, not elsewhere classified: Secondary | ICD-10-CM | POA: Diagnosis not present

## 2019-07-28 DIAGNOSIS — M7541 Impingement syndrome of right shoulder: Secondary | ICD-10-CM | POA: Diagnosis not present

## 2019-07-28 DIAGNOSIS — M75121 Complete rotator cuff tear or rupture of right shoulder, not specified as traumatic: Secondary | ICD-10-CM | POA: Diagnosis not present

## 2019-07-28 DIAGNOSIS — M6281 Muscle weakness (generalized): Secondary | ICD-10-CM | POA: Diagnosis not present

## 2019-08-02 DIAGNOSIS — M7541 Impingement syndrome of right shoulder: Secondary | ICD-10-CM | POA: Diagnosis not present

## 2019-08-02 DIAGNOSIS — M6281 Muscle weakness (generalized): Secondary | ICD-10-CM | POA: Diagnosis not present

## 2019-08-02 DIAGNOSIS — M25611 Stiffness of right shoulder, not elsewhere classified: Secondary | ICD-10-CM | POA: Diagnosis not present

## 2019-08-02 DIAGNOSIS — M75121 Complete rotator cuff tear or rupture of right shoulder, not specified as traumatic: Secondary | ICD-10-CM | POA: Diagnosis not present

## 2019-08-03 DIAGNOSIS — M25611 Stiffness of right shoulder, not elsewhere classified: Secondary | ICD-10-CM | POA: Diagnosis not present

## 2019-08-12 MED FILL — CITALOPRAM HBR 20 MG TABLET: 20 | 90 days supply | Qty: 90 | Fill #0

## 2019-09-07 MED FILL — PANTOPRAZOLE SOD DR 40 MG T: 40 | 30 days supply | Qty: 30 | Fill #1

## 2019-10-21 MED FILL — PRAVASTATIN SODIUM 20 MG TA: 20 | 90 days supply | Qty: 135 | Fill #0

## 2019-11-01 ENCOUNTER — Other Ambulatory Visit: Payer: Self-pay | Admitting: Family Medicine

## 2019-11-01 DIAGNOSIS — K219 Gastro-esophageal reflux disease without esophagitis: Secondary | ICD-10-CM

## 2019-11-01 MED FILL — PANTOPRAZOLE SOD DR 40 MG T: 40 | 30 days supply | Qty: 30 | Fill #0

## 2019-11-10 ENCOUNTER — Other Ambulatory Visit: Payer: Self-pay | Admitting: *Deleted

## 2019-11-10 ENCOUNTER — Encounter: Payer: Self-pay | Admitting: Family Medicine

## 2019-11-10 DIAGNOSIS — B001 Herpesviral vesicular dermatitis: Secondary | ICD-10-CM

## 2019-11-10 MED ORDER — VALACYCLOVIR HCL 1 G PO TABS: ORAL_TABLET | ORAL | 0 refills | Status: DC

## 2019-11-10 MED FILL — valACYclovir HCL 1 GM TABS: 1 | 7 days supply | Qty: 30 | Fill #0

## 2019-12-20 DIAGNOSIS — C44311 Basal cell carcinoma of skin of nose: Secondary | ICD-10-CM | POA: Diagnosis not present

## 2019-12-20 DIAGNOSIS — D225 Melanocytic nevi of trunk: Secondary | ICD-10-CM | POA: Diagnosis not present

## 2019-12-20 DIAGNOSIS — L910 Hypertrophic scar: Secondary | ICD-10-CM | POA: Diagnosis not present

## 2019-12-20 DIAGNOSIS — D2261 Melanocytic nevi of right upper limb, including shoulder: Secondary | ICD-10-CM | POA: Diagnosis not present

## 2019-12-20 DIAGNOSIS — D2262 Melanocytic nevi of left upper limb, including shoulder: Secondary | ICD-10-CM | POA: Diagnosis not present

## 2019-12-28 ENCOUNTER — Other Ambulatory Visit: Payer: Self-pay | Admitting: Family Medicine

## 2019-12-28 DIAGNOSIS — B001 Herpesviral vesicular dermatitis: Secondary | ICD-10-CM

## 2019-12-28 MED FILL — PANTOPRAZOLE SOD DR 40 MG T: 40 | 30 days supply | Qty: 30 | Fill #1

## 2019-12-31 ENCOUNTER — Other Ambulatory Visit: Payer: Self-pay | Admitting: Family Medicine

## 2019-12-31 MED FILL — valACYclovir HCL 1 GM TABS: 1 | 5 days supply | Qty: 20 | Fill #0

## 2020-01-13 DIAGNOSIS — C44311 Basal cell carcinoma of skin of nose: Secondary | ICD-10-CM | POA: Diagnosis not present

## 2020-01-13 MED FILL — DOXYCYCLINE HYC 100 MG CAPS: 100 | 5 days supply | Qty: 10 | Fill #0

## 2020-01-13 MED FILL — valACYclovir HCL 1 GM TABS: 1 | 5 days supply | Qty: 20 | Fill #0

## 2020-01-18 MED FILL — PRAVASTATIN SODIUM 20 MG TA: 20 | 90 days supply | Qty: 135 | Fill #1

## 2020-02-01 MED FILL — CITALOPRAM HBR 20 MG TABLET: 20 | 90 days supply | Qty: 90 | Fill #1

## 2020-04-14 ENCOUNTER — Other Ambulatory Visit: Payer: Self-pay | Admitting: Family Medicine

## 2020-04-14 DIAGNOSIS — E785 Hyperlipidemia, unspecified: Secondary | ICD-10-CM

## 2020-04-14 MED FILL — PANTOPRAZOLE SOD DR 40 MG T: 40 | 30 days supply | Qty: 30 | Fill #2

## 2020-04-16 ENCOUNTER — Other Ambulatory Visit: Payer: Self-pay | Admitting: Family Medicine

## 2020-04-17 MED FILL — PRAVASTATIN SODIUM 20 MG TA: 20 | 30 days supply | Qty: 45 | Fill #0

## 2020-05-22 ENCOUNTER — Ambulatory Visit (INDEPENDENT_AMBULATORY_CARE_PROVIDER_SITE_OTHER): Payer: BC Managed Care – PPO | Admitting: Family Medicine

## 2020-05-22 ENCOUNTER — Other Ambulatory Visit: Payer: Self-pay

## 2020-05-22 ENCOUNTER — Other Ambulatory Visit: Payer: Self-pay | Admitting: Family Medicine

## 2020-05-22 ENCOUNTER — Encounter: Payer: Self-pay | Admitting: Family Medicine

## 2020-05-22 VITALS — BP 132/84 | HR 76 | Temp 98.1°F | Resp 16 | Ht 69.0 in | Wt 203.8 lb

## 2020-05-22 DIAGNOSIS — F419 Anxiety disorder, unspecified: Secondary | ICD-10-CM

## 2020-05-22 DIAGNOSIS — Z23 Encounter for immunization: Secondary | ICD-10-CM

## 2020-05-22 DIAGNOSIS — F410 Panic disorder [episodic paroxysmal anxiety] without agoraphobia: Secondary | ICD-10-CM

## 2020-05-22 DIAGNOSIS — Z0000 Encounter for general adult medical examination without abnormal findings: Secondary | ICD-10-CM | POA: Diagnosis not present

## 2020-05-22 DIAGNOSIS — Z125 Encounter for screening for malignant neoplasm of prostate: Secondary | ICD-10-CM

## 2020-05-22 DIAGNOSIS — Z1329 Encounter for screening for other suspected endocrine disorder: Secondary | ICD-10-CM | POA: Diagnosis not present

## 2020-05-22 DIAGNOSIS — K219 Gastro-esophageal reflux disease without esophagitis: Secondary | ICD-10-CM | POA: Diagnosis not present

## 2020-05-22 DIAGNOSIS — E785 Hyperlipidemia, unspecified: Secondary | ICD-10-CM

## 2020-05-22 DIAGNOSIS — Z13228 Encounter for screening for other metabolic disorders: Secondary | ICD-10-CM

## 2020-05-22 DIAGNOSIS — Z130 Encounter for screening for diseases of the blood and blood-forming organs and certain disorders involving the immune mechanism: Secondary | ICD-10-CM

## 2020-05-22 MED ORDER — PANTOPRAZOLE SODIUM 20 MG PO TBEC: 20.0000 mg | DELAYED_RELEASE_TABLET | Freq: Every day | ORAL | 0 refills | Status: DC

## 2020-05-22 MED ORDER — CITALOPRAM HYDROBROMIDE 20 MG PO TABS: 20.0000 mg | ORAL_TABLET | Freq: Every day | ORAL | 3 refills | Status: DC

## 2020-05-22 MED FILL — CITALOPRAM HBR 20 MG TABLET: 20 | 90 days supply | Qty: 90 | Fill #0

## 2020-05-22 MED FILL — PANTOPRAZOLE SOD DR 20 MG T: 20 | 30 days supply | Qty: 30 | Fill #0

## 2020-05-22 NOTE — Patient Instructions (Addendum)
A few things to remember from today's visit:  Routine general medical examination at a health care facility  Hyperlipidemia, mild - Plan: Lipid panel  Gastroesophageal reflux disease  Screening for endocrine, metabolic and immunity disorder - Plan: COMPLETE METABOLIC PANEL WITH GFR, Hemoglobin A1c  Prostate cancer screening - Plan: PSA  If you need refills please call your pharmacy. Do not use My Chart to request refills or for acute issues that need immediate attention.   Try lower dose Protonix and let me know is still helps.  Please be sure medication list is accurate. If a new problem present, please set up appointment sooner than planned today.  At least 150 minutes of moderate exercise per week, daily brisk walking for 15-30 min is a good exercise option. Healthy diet low in saturated (animal) fats and sweets and consisting of fresh fruits and vegetables, lean meats such as fish and white chicken and whole grains.  - Vaccines:  Tdap vaccine every 10 years.  Shingles vaccine recommended at age 51, could be given after 56 years of age but not sure about insurance coverage.  Pneumonia vaccines: Pneumovax at 20  -Screening recommendations for low/normal risk males:  Screening for diabetes at age 41 and every 3 years. Earlier screening if cardiovascular risk factors.  Lipid screening at 35 and every 3 years. Screening starts in younger males with cardiovascular risk factors.N/A  Colon cancer screening is now at age 9 but your insurance may not cover until age 67 .screening is recommended age 68.  Prostate cancer screening: some controversy, starts usually at 41: Rectal exam and PSA.  Aortic Abdominal Aneurism once between 39 and 16 years old if ever smoker.  Also recommended:  1. Dental visit- Brush and floss your teeth twice daily; visit your dentist twice a year. 2. Eye doctor- Get an eye exam at least every 2 years. 3. Helmet use- Always wear a helmet when riding a  bicycle, motorcycle, rollerblading or skateboarding. 4. Safe sex- If you may be exposed to sexually transmitted infections, use a condom. 5. Seat belts- Seat belts can save your live; always wear one. 6. Smoke/Carbon Monoxide detectors- These detectors need to be installed on the appropriate level of your home. Replace batteries at least once a year. 7. Skin cancer- When out in the sun please cover up and use sunscreen 15 SPF or higher. 8. Violence- If anyone is threatening or hurting you, please tell your healthcare provider.  9. Drink alcohol in moderation- Limit alcohol intake to one drink or less per day. Never drink and drive.

## 2020-05-22 NOTE — Progress Notes (Signed)
HPI: [name] is a 54 y.o.male here today for his routine physical examination.  Last CPE: 04/19/19. Gary [name] lives with his wife.  Regular exercise 3 or more times per week: Not consistently but Gary [name] has an active job, 12000-13000 steps per day. Following a healthy diet: Gary [name] has not been consistent but wt has been stable.  Chronic medical problems: GERD, hyperlipidemia, anxiety.  Immunization History  Administered Date(s) Administered  . Influenza Split 03/28/2014  . Influenza Whole 04/07/2010  . Influenza-Unspecified 04/11/2015, 02/14/2016  . Td 07/08/1998, 03/01/2009  . Tdap 05/22/2020   -Hep C screening: 04/19/2019 NR.  Last colon cancer screening: 06/23/19, 5 years follow up recommended. Last prostate ca screening: PSA 3.4 on 04/19/19. Nocturia x 0-1.  No hx of tobacco use. Gary [name] does not drink alcohol.  -Concerns and/or follow up today:  Hyperlipidemia: Currently Gary [name] is on pravastatin 20 mg 1.5 tablet daily.  Component     Latest Ref Rng & Units 04/19/2019  Cholesterol     <200 mg/dL 192  Triglycerides     <150 mg/dL 153.0 (H)  HDL Cholesterol     > OR = 40 mg/dL 41.30  VLDL     0.0 - 40.0 mg/dL 30.6  LDL (calc)     0 - 99 mg/dL 120 (H)  Total CHOL/HDL Ratio     <5.0 (calc) 5  NonHDL      150.31   Anxiety: Currently Gary [name] is on Celexa 20 mg daily. Medication is still helping. No side effects.  GERD: Gary [name] is on Pantoprazole 40 mg daily. Symptoms well controlled while taking medication.  Review of Systems  Constitutional: Negative for activity change, appetite change, fatigue, fever and unexpected weight change.  HENT: Negative for dental problem, nosebleeds, sore throat, trouble swallowing and voice change.   Eyes: Negative for redness and visual disturbance.  Respiratory: Negative for cough, shortness of breath and wheezing.   Cardiovascular: Negative for chest pain, palpitations and leg swelling.  Gastrointestinal: Negative for abdominal pain, blood in  stool, nausea and vomiting.  Endocrine: Negative for cold intolerance, heat intolerance, polydipsia, polyphagia and polyuria.  Genitourinary: Negative for decreased urine volume, dysuria, genital sores, hematuria and testicular pain.  Musculoskeletal: Negative for gait problem and myalgias.  Skin: Negative for color change and rash.  Allergic/Immunologic: Positive for environmental allergies.  Neurological: Negative for syncope, weakness and headaches.  Hematological: Negative for adenopathy. Does not bruise/bleed easily.  Psychiatric/Behavioral: Negative for confusion and sleep disturbance. The patient is nervous/anxious.   All other systems reviewed and are negative.  Current Outpatient Medications on File Prior to Visit  Medication Sig Dispense Refill  . aspirin 81 MG tablet Take 81 mg by mouth daily.      . pantoprazole (PROTONIX) 40 MG tablet TAKE 1 TABLET BY MOUTH ONCE DAILY 30 tablet 2  . pravastatin (PRAVACHOL) 20 MG tablet Take 1.5 tablets (30 mg total) by mouth daily. NEEDS OFFICE VISIT 45 tablet 0  . valACYclovir (VALTREX) 1000 MG tablet TAKE 2 TABLETS BY MOUTH UPON ACUTE ONSET AND REPEAT IN 12 HOURS FOR 1 DAY AS NEEDED FOR FEVER BLISTERS 20 tablet 1   No current facility-administered medications on file prior to visit.   Past Medical History:  Diagnosis Date  . Allergy   . Anxiety   . GERD (gastroesophageal reflux disease)   . Hiatal hernia 2013   egd  . Hyperlipidemia     Past Surgical History:  Procedure Laterality Date  . [place]  2013   melanoma removed from back  . rotater cuff repair Right 2020  . TONSILLECTOMY    . UPPER GASTROINTESTINAL ENDOSCOPY  2013   hh   No Known Allergies  Family History  Problem Relation Age of Onset  . Breast cancer Mother   . Uterine cancer Mother   . Leukemia Father   . Arthritis Other   . Hypertension Other   . Thyroid disease Other   . Uterine cancer Other   . Heart disease Other   . Crohn's disease  Maternal Grandmother   . Colon polyps Brother   . Colon cancer Other        great maternal grandfather   . Colon cancer Other        great uncle   . Esophageal cancer Neg Hx   . Rectal cancer Neg Hx   . Stomach cancer Neg Hx     Social History   Socioeconomic History  . Marital status: Married    Spouse name: Not on file  . Number of children: 1  . Years of education: Not on file  . Highest education level: Not on file  Occupational History  . Occupation: Chemical engineer: UNEMPLOYED  Tobacco Use  . Smoking status: Former Research scientist (life sciences)  . Smokeless tobacco: Never Used  Substance and Sexual Activity  . Alcohol use: Not Currently    Comment: Drinks a beer once in awhile.  Not often  . Drug use: No  . Sexual activity: Not on file  Other Topics Concern  . Not on file  Social History Narrative  . Not on file   Social Determinants of Health   Financial Resource Strain:   . Difficulty of Paying Living Expenses: Not on file  Food Insecurity:   . Worried About Charity fundraiser in the Last Year: Not on file  . Ran Out of Food in the Last Year: Not on file  Transportation Needs:   . Lack of Transportation (Medical): Not on file  . Lack of Transportation (Non-Medical): Not on file  Physical Activity:   . Days of Exercise per Week: Not on file  . Minutes of Exercise per Session: Not on file  Stress:   . Feeling of Stress : Not on file  Social Connections:   . Frequency of Communication with Friends and Family: Not on file  . Frequency of Social Gatherings with Friends and Family: Not on file  . Attends Religious Services: Not on file  . Active Member of Clubs or Organizations: Not on file  . Attends Archivist Meetings: Not on file  . Marital Status: Not on file   Vitals:   05/22/20 0910  BP: 132/84  Pulse: 76  Resp: 16  Temp: 98.1 F (36.7 C)  SpO2: 95%   Body mass index is 30.1 kg/m.  Wt Readings from Last 3 Encounters:  05/22/20 203 lb 12.8  oz (92.4 kg)  06/23/19 200 lb (90.7 kg)  06/09/19 205 lb (93 kg)   Physical Exam Vitals and nursing note reviewed.  Constitutional:      General: Gary [name] is not in acute distress.    Appearance: Gary [name] is well-developed.  HENT:     Head: Normocephalic and atraumatic.     Right Ear: Tympanic membrane, ear canal and external ear normal.     Left Ear: Tympanic membrane, ear canal and external ear normal.     Mouth/Throat:     Mouth: Mucous membranes are moist.  Pharynx: Oropharynx is clear.  Eyes:     Extraocular Movements: Extraocular movements intact.     Conjunctiva/sclera: Conjunctivae normal.     Pupils: Pupils are equal, round, and reactive to light.  Neck:     Thyroid: No thyromegaly.     Trachea: No tracheal deviation.  Cardiovascular:     Rate and Rhythm: Normal rate and regular rhythm.     Pulses:          Dorsalis pedis pulses are 2+ on the right side and 2+ on the left side.     Heart sounds: No murmur heard.   Pulmonary:     Effort: Pulmonary effort is normal. No respiratory distress.     Breath sounds: Normal breath sounds.  Abdominal:     Palpations: Abdomen is soft. There is no hepatomegaly or mass.     Tenderness: There is no abdominal tenderness.  Genitourinary:    Comments: No concerns. Musculoskeletal:        General: No tenderness.     Cervical back: Normal range of motion.     Comments: No major deformities appreciated and no signs of synovitis.  Lymphadenopathy:     Cervical: No cervical adenopathy.     Upper Body:     Right upper body: No supraclavicular adenopathy.     Left upper body: No supraclavicular adenopathy.  Skin:    General: Skin is warm.     Findings: No erythema.  Neurological:     General: No focal deficit present.     Mental Status: Gary [name] is alert and oriented to person, place, and time.     Cranial Nerves: No cranial nerve deficit.     Sensory: No sensory deficit.     Coordination: Coordination normal.     Gait: Gait normal.     Deep  Tendon Reflexes:     Reflex Scores:      Bicep reflexes are 2+ on the right side and 2+ on the left side.      Patellar reflexes are 2+ on the right side and 2+ on the left side. Psychiatric:        Mood and Affect: Mood and affect normal.   ASSESSMENT AND PLAN:  Gary [name] was seen today for annual exam and follow-up.  Diagnoses and all orders for this visit: Orders Placed This Encounter  Procedures  . Tdap vaccine greater than or equal to 7yo IM  . COMPLETE METABOLIC PANEL WITH GFR  . Hemoglobin A1c  . Lipid panel  . PSA   Lab Results  Component Value Date   PSA 2.82 05/22/2020   PSA 3.47 04/19/2019   PSA 2.63 11/04/2017    Lab Results  Component Value Date   CHOL 175 05/22/2020   HDL 50 05/22/2020   LDLCALC 107 (H) 05/22/2020   LDLDIRECT 118.0 09/26/2016   TRIG 87 05/22/2020   CHOLHDL 3.5 05/22/2020   Lab Results  Component Value Date   CREATININE 1.09 05/22/2020   BUN 15 05/22/2020   NA 138 05/22/2020   K 5.1 05/22/2020   CL 104 05/22/2020   CO2 28 05/22/2020   Lab Results  Component Value Date   ALT 24 05/22/2020   AST 26 05/22/2020   ALKPHOS 73 04/19/2019   BILITOT 0.5 05/22/2020   Lab Results  Component Value Date   HGBA1C 5.7 (H) 05/22/2020    Routine general medical examination at a health care facility We discussed the importance of regular physical activity and healthy diet  for prevention of chronic illness and/or complications. Preventive guidelines reviewed. Vaccination updated. Next CPE in a year.  The 10-year ASCVD risk score Mikey Bussing DC Brooke Bonito., et al., 2013) is: 5.6%   Values used to calculate the score:     Age: 66 years     Sex: Male     Is Non-Hispanic African American: No     Diabetic: No     Tobacco smoker: No     Systolic Blood Pressure: 341 mmHg     Is BP treated: No     HDL Cholesterol: 50 mg/dL     Total Cholesterol: 175 mg/dL  Screening for endocrine, metabolic and immunity disorder -     Hemoglobin A1c -     COMPLETE  METABOLIC PANEL WITH GFR  Prostate cancer screening -     PSA  Need for Tdap vaccination -     Tdap vaccine greater than or equal to 7yo IM  Anxiety disorder, unspecified type Problem is well controlled. No changes in current management.  -     citalopram (CELEXA) 20 MG tablet; Take 1 tablet (20 mg total) by mouth daily.  Hyperlipidemia, mild Continue Pravastatin 30 mg daily, will adjust dose according to FLP.  Gastroesophageal reflux disease Problem is well controlled with PPI. Some side effects discussed. Gary [name] agrees with trying to decrease Protonix dose from 40 mg to 20 mg daily and will let me know in a couple weeks. GERD precautions also recommended.  Return in about 1 year (around 05/22/2021) for CPE.   Scherrie Seneca G. Martinique, MD  [facility] [place] office.  A few things to remember from today's visit:  Routine general medical examination at a health care facility  Hyperlipidemia, mild - Plan: Lipid panel  Gastroesophageal reflux disease  Screening for endocrine, metabolic and immunity disorder - Plan: COMPLETE METABOLIC PANEL WITH GFR, Hemoglobin A1c  Prostate cancer screening - Plan: PSA  If you need refills please call your pharmacy. Do not use My Chart to request refills or for acute issues that need immediate attention.   Try lower dose Protonix and let me know is still helps.  Please be sure medication list is accurate. If a new problem present, please set up appointment sooner than planned today.  At least 150 minutes of moderate exercise per week, daily brisk walking for 15-30 min is a good exercise option. Healthy diet low in saturated (animal) fats and sweets and consisting of fresh fruits and vegetables, lean meats such as fish and white chicken and whole grains.  - Vaccines:  Tdap vaccine every 10 years.  Shingles vaccine recommended at age 10, could be given after 56 years of age but not sure about insurance coverage.  Pneumonia  vaccines: Pneumovax at 27  -Screening recommendations for low/normal risk males:  Screening for diabetes at age 47 and every 3 years. Earlier screening if cardiovascular risk factors.  Lipid screening at 35 and every 3 years. Screening starts in younger males with cardiovascular risk factors.N/A  Colon cancer screening is now at age 59 but your insurance may not cover until age 56 .screening is recommended age 59.  Prostate cancer screening: some controversy, starts usually at 18: Rectal exam and PSA.  Aortic Abdominal Aneurism once between 35 and 43 years old if ever smoker.  Also recommended:  1. Dental visit- Brush and floss your teeth twice daily; visit your dentist twice a year. 2. Eye doctor- Get an eye exam at least every 2 years. 3. Helmet use-  Always wear a helmet when riding a bicycle, motorcycle, rollerblading or skateboarding. 4. Safe sex- If you may be exposed to sexually transmitted infections, use a condom. 5. Seat belts- Seat belts can save your live; always wear one. 6. Smoke/Carbon Monoxide detectors- These detectors need to be installed on the appropriate level of your home. Replace batteries at least once a year. 7. Skin cancer- When out in the sun please cover up and use sunscreen 15 SPF or higher. 8. Violence- If anyone is threatening or hurting you, please tell your healthcare provider.  9. Drink alcohol in moderation- Limit alcohol intake to one drink or less per day. Never drink and drive.

## 2020-05-22 NOTE — Assessment & Plan Note (Addendum)
Problem is well controlled with PPI. Some side effects discussed. He agrees with trying to decrease Protonix dose from 40 mg to 20 mg daily and will let me know in a couple weeks. GERD precautions also recommended.

## 2020-05-22 NOTE — Assessment & Plan Note (Signed)
Continue Pravastatin 30 mg daily, will adjust dose according to FLP.

## 2020-05-23 LAB — COMPLETE METABOLIC PANEL WITH GFR
AG Ratio: 2 (calc) (ref 1.0–2.5)
ALT: 24 U/L (ref 9–46)
AST: 26 U/L (ref 10–35)
Albumin: 4.4 g/dL (ref 3.6–5.1)
Alkaline phosphatase (APISO): 60 U/L (ref 35–144)
BUN: 15 mg/dL (ref 7–25)
CO2: 28 mmol/L (ref 20–32)
Calcium: 9.5 mg/dL (ref 8.6–10.3)
Chloride: 104 mmol/L (ref 98–110)
Creat: 1.09 mg/dL (ref 0.70–1.33)
GFR, Est African American: 87 mL/min/{1.73_m2} (ref >=60)
GFR, Est Non African American: 75 mL/min/{1.73_m2} (ref >=60)
Globulin: 2.2 g/dL (calc) (ref 1.9–3.7)
Glucose, Bld: 92 mg/dL (ref 65–99)
Potassium: 5.1 mmol/L (ref 3.5–5.3)
Sodium: 138 mmol/L (ref 135–146)
Total Bilirubin: 0.5 mg/dL (ref 0.2–1.2)
Total Protein: 6.6 g/dL (ref 6.1–8.1)

## 2020-05-23 LAB — LIPID PANEL
Cholesterol: 175 mg/dL (ref <200)
HDL: 50 mg/dL (ref >=40)
LDL Cholesterol (Calc): 107 mg/dL (calc) — ABNORMAL HIGH
Non-HDL Cholesterol (Calc): 125 mg/dL (calc) (ref <130)
Total CHOL/HDL Ratio: 3.5 (calc) (ref <5.0)
Triglycerides: 87 mg/dL (ref <150)

## 2020-05-23 LAB — HEMOGLOBIN A1C
Hgb A1c MFr Bld: 5.7 % of total Hgb — ABNORMAL HIGH (ref <5.7)
Mean Plasma Glucose: 117 (calc)
eAG (mmol/L): 6.5 (calc)

## 2020-05-23 LAB — PSA: PSA: 2.82 ng/mL (ref <=4.0)

## 2020-05-25 ENCOUNTER — Encounter: Payer: Self-pay | Admitting: Family Medicine

## 2020-05-25 ENCOUNTER — Other Ambulatory Visit: Payer: Self-pay | Admitting: Family Medicine

## 2020-05-25 MED ORDER — PRAVASTATIN SODIUM 20 MG PO TABS: 30.0000 mg | ORAL_TABLET | Freq: Every day | ORAL | 3 refills | Status: DC

## 2020-05-25 MED ORDER — PANTOPRAZOLE SODIUM 20 MG PO TBEC: 20.0000 mg | DELAYED_RELEASE_TABLET | Freq: Every day | ORAL | 0 refills | Status: DC

## 2020-05-26 MED FILL — PRAVASTATIN SODIUM 20 MG TA: 20 | 90 days supply | Qty: 135 | Fill #0

## 2020-06-20 DIAGNOSIS — D2261 Melanocytic nevi of right upper limb, including shoulder: Secondary | ICD-10-CM | POA: Diagnosis not present

## 2020-06-20 DIAGNOSIS — L821 Other seborrheic keratosis: Secondary | ICD-10-CM | POA: Diagnosis not present

## 2020-06-20 DIAGNOSIS — D225 Melanocytic nevi of trunk: Secondary | ICD-10-CM | POA: Diagnosis not present

## 2020-06-20 DIAGNOSIS — Z85828 Personal history of other malignant neoplasm of skin: Secondary | ICD-10-CM | POA: Diagnosis not present

## 2020-08-07 ENCOUNTER — Other Ambulatory Visit: Payer: Self-pay | Admitting: Family Medicine

## 2020-08-07 DIAGNOSIS — K219 Gastro-esophageal reflux disease without esophagitis: Secondary | ICD-10-CM

## 2020-08-07 MED FILL — PANTOPRAZOLE SOD DR 20 MG T: 20 | 30 days supply | Qty: 30 | Fill #0

## 2020-08-07 MED FILL — valACYclovir HCL 1 GM TABS: 1 | 5 days supply | Qty: 20 | Fill #1

## 2020-08-14 MED FILL — PRAVASTATIN NA 20 MG TAB: 20 | 90 days supply | Qty: 135 | Fill #0

## 2020-09-13 ENCOUNTER — Other Ambulatory Visit: Payer: Self-pay | Admitting: Family Medicine

## 2020-09-13 DIAGNOSIS — K219 Gastro-esophageal reflux disease without esophagitis: Secondary | ICD-10-CM

## 2020-09-13 MED FILL — PANTOPRAZOLE SOD DR 20 MG T: 20 | 90 days supply | Qty: 90 | Fill #0

## 2020-09-28 MED FILL — PANTOPRAZOLE SOD DR 20 MG T: 20 | 90 days supply | Qty: 90 | Fill #0

## 2020-10-18 ENCOUNTER — Other Ambulatory Visit: Payer: Self-pay | Admitting: Family Medicine

## 2020-10-18 ENCOUNTER — Other Ambulatory Visit (HOSPITAL_COMMUNITY): Payer: Self-pay

## 2020-10-18 ENCOUNTER — Ambulatory Visit (INDEPENDENT_AMBULATORY_CARE_PROVIDER_SITE_OTHER): Payer: BC Managed Care – PPO

## 2020-10-18 ENCOUNTER — Ambulatory Visit: Payer: BC Managed Care – PPO | Admitting: Podiatry

## 2020-10-18 ENCOUNTER — Other Ambulatory Visit: Payer: Self-pay

## 2020-10-18 DIAGNOSIS — R233 Spontaneous ecchymoses: Secondary | ICD-10-CM

## 2020-10-18 DIAGNOSIS — L309 Dermatitis, unspecified: Secondary | ICD-10-CM

## 2020-10-18 DIAGNOSIS — B001 Herpesviral vesicular dermatitis: Secondary | ICD-10-CM

## 2020-10-18 DIAGNOSIS — M79671 Pain in right foot: Secondary | ICD-10-CM

## 2020-10-18 MED ORDER — BETAMETHASONE DIPROPIONATE 0.05 % EX CREA
TOPICAL_CREAM | Freq: Two times a day (BID) | CUTANEOUS | 1 refills | Status: DC
  Filled 2020-10-18: qty 30, 14d supply, fill #0

## 2020-10-18 MED ORDER — VALACYCLOVIR HCL 1 G PO TABS
ORAL_TABLET | ORAL | 1 refills | Status: DC
  Filled 2020-10-18: qty 20, 5d supply, fill #0
  Filled 2021-02-01: qty 20, 5d supply, fill #1

## 2020-10-18 NOTE — Progress Notes (Signed)
   HPI: 55 y.o. male presenting today as a new patient for evaluation of discoloration with slight tenderness to the plantar arch of the right foot this been going on for approximately 4-5 months.  He believes it may have started with a change in shoe gear and when he wore his custom molded orthotics that he is always had.  He denies a history of injury.  He presents for further treatment and evaluation  Past Medical History:  Diagnosis Date  . Allergy   . Anxiety   . GERD (gastroesophageal reflux disease)   . Hiatal hernia 2013   egd  . Hyperlipidemia        Physical Exam: General: The patient is alert and oriented x3 in no acute distress.  Dermatology: Skin is warm, dry and supple bilateral lower extremities. Negative for open lesions or macerations.  Patient denies itching or any pruritus to the area  Vascular: Palpable pedal pulses bilaterally. No edema or erythema noted. Capillary refill within normal limits.  Neurological: Epicritic and protective threshold grossly intact bilaterally.   Musculoskeletal Exam: Range of motion within normal limits to all pedal and ankle joints bilateral. Muscle strength 5/5 in all groups bilateral.   Radiographic Exam:  Normal osseous mineralization. Joint spaces preserved. No fracture/dislocation/boney destruction.    Assessment: 1.  Petechiae, likely traumatic right plantar foot   Plan of Care:  1. Patient evaluated. X-Rays reviewed.  2.  Prescription for betamethasone cream apply 2 times daily 3.  Recommend good shoes that support the foot and or not aggravating  4.  Return to clinic as needed  Materials engineer at [facility], DPM [facility]  Dr. Edrick Kins, DPM    [address], [place] [address]                Office [phone]  Fax [phone]

## 2020-10-19 ENCOUNTER — Other Ambulatory Visit (HOSPITAL_COMMUNITY): Payer: Self-pay

## 2020-11-08 ENCOUNTER — Other Ambulatory Visit (HOSPITAL_COMMUNITY): Payer: Self-pay

## 2020-11-08 MED FILL — Pravastatin Sodium Tab 20 MG: ORAL | 90 days supply | Qty: 135 | Fill #0 | Status: AC

## 2020-11-13 ENCOUNTER — Other Ambulatory Visit: Payer: Self-pay | Admitting: Podiatry

## 2020-11-13 DIAGNOSIS — R233 Spontaneous ecchymoses: Secondary | ICD-10-CM

## 2020-11-22 ENCOUNTER — Telehealth (INDEPENDENT_AMBULATORY_CARE_PROVIDER_SITE_OTHER): Payer: BC Managed Care – PPO | Admitting: Family Medicine

## 2020-11-22 ENCOUNTER — Encounter: Payer: Self-pay | Admitting: Family Medicine

## 2020-11-22 VITALS — Temp 98.6°F

## 2020-11-22 DIAGNOSIS — J019 Acute sinusitis, unspecified: Secondary | ICD-10-CM

## 2020-11-22 MED ORDER — AZITHROMYCIN 250 MG PO TABS: ORAL_TABLET | ORAL | 0 refills | Status: DC

## 2020-11-22 NOTE — Progress Notes (Signed)
Subjective:    Patient ID: Gary [name], male    DOB: [birth year], 55 y.o.   MRN: [id number]  HPI Virtual Visit via Video Note  I connected with the patient on 11/22/20 at  2:45 PM EDT by a video enabled telemedicine application and verified that I am speaking with the correct person using two identifiers.  Location patient: home Location provider:work or home office Persons participating in the virtual visit: patient, provider  I discussed the limitations of evaluation and management by telemedicine and the availability of in person appointments. The patient expressed understanding and agreed to proceed.   HPI: Here for 5 days of stuffy head, sinus pressure, PND, ST, and a dry cough. No fever or SOB or body aches. No NVD. He has tested negative for the Covid virus twice this week. Taking Claritin and Delsym.   ROS: See pertinent positives and negatives per HPI.  Past Medical History:  Diagnosis Date  . Allergy   . Anxiety   . GERD (gastroesophageal reflux disease)   . Hiatal hernia 2013   egd  . Hyperlipidemia     Past Surgical History:  Procedure Laterality Date  . MOHS SURGERY  november 2013   melanoma removed from back  . rotater cuff repair Right 2020  . TONSILLECTOMY    . UPPER GASTROINTESTINAL ENDOSCOPY  2013   hh    Family History  Problem Relation Age of Onset  . Breast cancer Mother   . Uterine cancer Mother   . Leukemia Father   . Arthritis Other   . Hypertension Other   . Thyroid disease Other   . Uterine cancer Other   . Heart disease Other   . Crohn's disease Maternal Grandmother   . Colon polyps Brother   . Colon cancer Other        great maternal grandfather   . Colon cancer Other        great uncle   . Esophageal cancer Neg Hx   . Rectal cancer Neg Hx   . Stomach cancer Neg Hx      Current Outpatient Medications:  .  aspirin 81 MG tablet, Take 81 mg by mouth daily., Disp: , Rfl:  .  azithromycin (ZITHROMAX Z-PAK) 250 MG tablet, As  directed, Disp: 6 each, Rfl: 0 .  betamethasone dipropionate 0.05 % cream, Apply topically 2 (two) times daily., Disp: 30 g, Rfl: 1 .  citalopram (CELEXA) 20 MG tablet, TAKE 1 TABLET BY MOUTH ONCE A DAY, Disp: 90 tablet, Rfl: 3 .  pantoprazole (PROTONIX) 20 MG tablet, TAKE 1 TABLET BY MOUTH ONCE A DAY, Disp: 90 tablet, Rfl: 2 .  pravastatin (PRAVACHOL) 20 MG tablet, TAKE 1 AND 1/2 TABLETS BY MOUTH ONCE A DAY, Disp: 135 tablet, Rfl: 2 .  valACYclovir (VALTREX) 1000 MG tablet, TAKE 2 TABLETS BY MOUTH AT ONSET OF FEVER BLISTERS AND REPEAT IN 12 HOURS FOR 1 DAY AS NEEDED, Disp: 20 tablet, Rfl: 1 .  pantoprazole (PROTONIX) 40 MG tablet, TAKE 1 TABLET BY MOUTH ONCE DAILY, Disp: 30 tablet, Rfl: 2  EXAM:  VITALS per patient if applicable:  GENERAL: alert, oriented, appears well and in no acute distress  HEENT: atraumatic, conjunttiva clear, no obvious abnormalities on inspection of external nose and ears  NECK: normal movements of the head and neck  LUNGS: on inspection no signs of respiratory distress, breathing rate appears normal, no obvious gross SOB, gasping or wheezing  CV: no obvious cyanosis  MS: moves all visible  extremities without noticeable abnormality  PSYCH/NEURO: pleasant and cooperative, no obvious depression or anxiety, speech and thought processing grossly intact  ASSESSMENT AND PLAN: Sinusitis, treat with a Zpack.  Alysia Penna, MD  Discussed the following assessment and plan:  No diagnosis found.     I discussed the assessment and treatment plan with the patient. The patient was provided an opportunity to ask questions and all were answered. The patient agreed with the plan and demonstrated an understanding of the instructions.   The patient was advised to call back or seek an in-person evaluation if the symptoms worsen or if the condition fails to improve as anticipated.     Review of Systems     Objective:   Physical Exam        Assessment & Plan:

## 2020-12-19 DIAGNOSIS — D2261 Melanocytic nevi of right upper limb, including shoulder: Secondary | ICD-10-CM | POA: Diagnosis not present

## 2020-12-19 DIAGNOSIS — Z85828 Personal history of other malignant neoplasm of skin: Secondary | ICD-10-CM | POA: Diagnosis not present

## 2020-12-19 DIAGNOSIS — L821 Other seborrheic keratosis: Secondary | ICD-10-CM | POA: Diagnosis not present

## 2020-12-19 DIAGNOSIS — D2262 Melanocytic nevi of left upper limb, including shoulder: Secondary | ICD-10-CM | POA: Diagnosis not present

## 2021-01-19 ENCOUNTER — Other Ambulatory Visit (HOSPITAL_COMMUNITY): Payer: Self-pay

## 2021-01-19 MED FILL — Citalopram Hydrobromide Tab 20 MG (Base Equiv): ORAL | 90 days supply | Qty: 90 | Fill #0 | Status: AC

## 2021-02-01 ENCOUNTER — Other Ambulatory Visit (HOSPITAL_COMMUNITY): Payer: Self-pay

## 2021-02-01 MED FILL — Pravastatin Sodium Tab 20 MG: ORAL | 90 days supply | Qty: 135 | Fill #1 | Status: AC

## 2021-02-01 MED FILL — Pantoprazole Sodium EC Tab 20 MG (Base Equiv): ORAL | 90 days supply | Qty: 90 | Fill #0 | Status: AC

## 2021-02-27 ENCOUNTER — Encounter: Payer: Self-pay | Admitting: Family Medicine

## 2021-03-03 ENCOUNTER — Other Ambulatory Visit (HOSPITAL_COMMUNITY): Payer: Self-pay

## 2021-04-30 ENCOUNTER — Other Ambulatory Visit (HOSPITAL_COMMUNITY): Payer: Self-pay

## 2021-04-30 ENCOUNTER — Other Ambulatory Visit: Payer: Self-pay | Admitting: Family Medicine

## 2021-04-30 DIAGNOSIS — E785 Hyperlipidemia, unspecified: Secondary | ICD-10-CM

## 2021-04-30 MED ORDER — PRAVASTATIN SODIUM 20 MG PO TABS
ORAL_TABLET | ORAL | 2 refills | Status: DC
Start: 2021-04-30 — End: 2022-01-18
  Filled 2021-04-30: qty 135, 90d supply, fill #0
  Filled 2021-07-24: qty 135, 90d supply, fill #1
  Filled 2021-10-23: qty 135, 90d supply, fill #2

## 2021-05-20 NOTE — Progress Notes (Signed)
HPI: Mr. TERREON EKHOLM is a 55 y.o.male here today for his routine physical examination.  Last CPE: 05/22/20 He lives with his wife. Regular exercise 3 or more times per week: He is getting about 10,000 steps daily walking at work. Following a healthy diet: For the past 2 months she decreased sweets,carb,and decreased portions. He has lost some wt.  Chronic medical problems: HLD,anxiety,GERD.  Immunization History  Administered Date(s) Administered   Influenza Split 03/28/2014   Influenza Whole 04/07/2010   Influenza-Unspecified 04/11/2015, 02/14/2016   PFIZER Comirnaty(Gray Top)Covid-19 Tri-Sucrose Vaccine 05/05/2020   PFIZER(Purple Top)SARS-COV-2 Vaccination 07/08/2019, 08/08/2019, 06/06/2020   Td 07/08/1998, 03/01/2009   Tdap 05/22/2020   Zoster Recombinat (Shingrix) 05/21/2021   Health Maintenance  Topic Date Due   COVID-19 Vaccine (5 - Booster) 06/06/2021 (Originally 08/01/2020)   INFLUENZA VACCINE  10/05/2021 (Originally 02/05/2021)   HIV Screening  05/21/2029 (Originally 03/27/1981)   Zoster Vaccines- Shingrix (2 of 2) 07/16/2021   COLONOSCOPY (Pts 45-62yrs Insurance coverage will need to be confirmed)  06/22/2024   TETANUS/TDAP  05/22/2030   Hepatitis C Screening  Completed   Pneumococcal Vaccine 32-50 Years old  Aged Out   HPV VACCINES  Aged Out   Lab Results  Component Value Date   PSA 2.82 05/22/2020   PSA 3.47 04/19/2019   PSA 2.63 11/04/2017   Nocturia x 0  Negative for high alcohol intake or tobacco use.  -Concerns and/or follow up today:   GERD on Protonix 20 mg daily.  HLD on Pravastatin 20 mg daily.  Lab Results  Component Value Date   CHOL 175 05/22/2020   HDL 50 05/22/2020   LDLCALC 107 (H) 05/22/2020   LDLDIRECT 118.0 09/26/2016   TRIG 87 05/22/2020   CHOLHDL 3.5 05/22/2020   Lab Results  Component Value Date   HGBA1C 5.7 (H) 05/22/2020   LUQ cramp intermittent for the past 2 months. He has not identified exacerbating or alleviating  factors. No associated changes in bowel habits or blood in stool.  Anxiety: He is on Celexa 20 mg 1/2 tab daily. He has been on medication for 14 years. Medication has helped. No side effects.  Review of Systems  Constitutional:  Negative for activity change, appetite change, fatigue and fever.  HENT:  Negative for mouth sores, nosebleeds, sore throat, trouble swallowing and voice change.   Eyes:  Negative for redness and visual disturbance.  Respiratory:  Negative for cough, shortness of breath and wheezing.   Cardiovascular:  Negative for chest pain, palpitations and leg swelling.  Gastrointestinal:  Negative for blood in stool, nausea and vomiting.       Negative for changes in bowel habits.  Endocrine: Negative for cold intolerance, heat intolerance, polydipsia, polyphagia and polyuria.  Genitourinary:  Negative for decreased urine volume, dysuria, genital sores, hematuria and testicular pain.  Musculoskeletal:  Negative for gait problem and myalgias.  Skin:  Negative for color change and rash.  Allergic/Immunologic: Positive for environmental allergies.  Neurological:  Negative for syncope, weakness and headaches.  Hematological:  Negative for adenopathy. Does not bruise/bleed easily.  Psychiatric/Behavioral:  Negative for confusion and sleep disturbance.    Current Outpatient Medications on File Prior to Visit  Medication Sig Dispense Refill   aspirin 81 MG tablet Take 81 mg by mouth daily.     betamethasone dipropionate 0.05 % cream Apply topically 2 (two) times daily. 30 g 1   pantoprazole (PROTONIX) 20 MG tablet TAKE 1 TABLET BY MOUTH ONCE A DAY 90 tablet 2  pravastatin (PRAVACHOL) 20 MG tablet TAKE 1 AND 1/2 TABLETS BY MOUTH ONCE A DAY 135 tablet 2   valACYclovir (VALTREX) 1000 MG tablet TAKE 2 TABLETS BY MOUTH AT ONSET OF FEVER BLISTERS AND REPEAT IN 12 HOURS FOR 1 DAY AS NEEDED 20 tablet 1   No current facility-administered medications on file prior to visit.   Past  Medical History:  Diagnosis Date   Allergy    Anxiety    GERD (gastroesophageal reflux disease)    Hiatal hernia 2013   egd   Hyperlipidemia     Past Surgical History:  Procedure Laterality Date   MOHS SURGERY  november 2013   melanoma removed from back   rotater cuff repair Right 2020   TONSILLECTOMY     UPPER GASTROINTESTINAL ENDOSCOPY  2013   hh   No Known Allergies  Family History  Problem Relation Age of Onset   Breast cancer Mother    Uterine cancer Mother    Leukemia Father    Arthritis Other    Hypertension Other    Thyroid disease Other    Uterine cancer Other    Heart disease Other    Crohn's disease Maternal Grandmother    Colon polyps Brother    Colon cancer Other        great maternal grandfather    Colon cancer Other        great uncle    Esophageal cancer Neg Hx    Rectal cancer Neg Hx    Stomach cancer Neg Hx     Social History   Socioeconomic History   Marital status: Married    Spouse name: Not on file   Number of children: 1   Years of education: Not on file   Highest education level: Not on file  Occupational History   Occupation: Chemical engineer: UNEMPLOYED  Tobacco Use   Smoking status: Former   Smokeless tobacco: Never  Substance and Sexual Activity   Alcohol use: Not Currently    Comment: Drinks a beer once in awhile.  Not often   Drug use: No   Sexual activity: Not on file  Other Topics Concern   Not on file  Social History Narrative   Not on file   Social Determinants of Health   Financial Resource Strain: Not on file  Food Insecurity: Not on file  Transportation Needs: Not on file  Physical Activity: Not on file  Stress: Not on file  Social Connections: Not on file   Vitals:   05/21/21 0832  BP: 128/80  Pulse: 65  Resp: 16  Temp: 97.8 F (36.6 C)  SpO2: 98%   Body mass index is 28.41 kg/m.  Wt Readings from Last 3 Encounters:  05/21/21 192 lb 6.4 oz (87.3 kg)  05/22/20 203 lb 12.8 oz (92.4  kg)  06/23/19 200 lb (90.7 kg)   Physical Exam Vitals and nursing note reviewed.  Constitutional:      General: He is not in acute distress.    Appearance: He is well-developed.  HENT:     Head: Normocephalic and atraumatic.     Right Ear: Tympanic membrane, ear canal and external ear normal.     Left Ear: Tympanic membrane, ear canal and external ear normal.     Mouth/Throat:     Mouth: Mucous membranes are moist.     Pharynx: Oropharynx is clear.  Eyes:     Extraocular Movements: Extraocular movements intact.     Conjunctiva/sclera:  Conjunctivae normal.     Pupils: Pupils are equal, round, and reactive to light.  Neck:     Thyroid: No thyromegaly.     Trachea: No tracheal deviation.  Cardiovascular:     Rate and Rhythm: Normal rate and regular rhythm.     Pulses:          Dorsalis pedis pulses are 2+ on the right side and 2+ on the left side.     Heart sounds: No murmur heard. Pulmonary:     Effort: Pulmonary effort is normal. No respiratory distress.     Breath sounds: Normal breath sounds.  Abdominal:     Palpations: Abdomen is soft. There is no hepatomegaly or mass.     Tenderness: There is no abdominal tenderness.  Genitourinary:    Comments: No concerns. Musculoskeletal:        General: No tenderness.     Cervical back: Normal range of motion.     Comments: No major deformities appreciated and no signs of synovitis.  Lymphadenopathy:     Cervical: No cervical adenopathy.     Upper Body:     Right upper body: No supraclavicular adenopathy.     Left upper body: No supraclavicular adenopathy.  Skin:    General: Skin is warm.     Findings: No erythema.  Neurological:     Mental Status: He is alert and oriented to person, place, and time.     Cranial Nerves: No cranial nerve deficit.     Sensory: No sensory deficit.     Coordination: Coordination normal.     Gait: Gait normal.     Deep Tendon Reflexes:     Reflex Scores:      Bicep reflexes are 2+ on the right  side and 2+ on the left side.      Patellar reflexes are 2+ on the right side and 2+ on the left side.  ASSESSMENT AND PLAN:  Mr.Montez was seen today for annual exam.  Diagnoses and all orders for this visit: Orders Placed This Encounter  Procedures   Varicella-zoster vaccine IM   Comprehensive metabolic panel   Hemoglobin A1c   Lipid panel   CBC   PSA   Lab Results  Component Value Date   WBC 5.0 05/21/2021   HGB 15.0 05/21/2021   HCT 44.7 05/21/2021   MCV 88.1 05/21/2021   PLT 224.0 05/21/2021   Lab Results  Component Value Date   CREATININE 1.20 05/21/2021   BUN 15 05/21/2021   NA 139 05/21/2021   K 4.5 05/21/2021   CL 104 05/21/2021   CO2 27 05/21/2021   Lab Results  Component Value Date   PSA 3.61 05/21/2021   Lab Results  Component Value Date   CHOL 160 05/21/2021   HDL 48.60 05/21/2021   LDLCALC 98 05/21/2021   LDLDIRECT 118.0 09/26/2016   TRIG 67.0 05/21/2021   CHOLHDL 3 05/21/2021   Lab Results  Component Value Date   HGBA1C 6.2 05/21/2021   Lab Results  Component Value Date   ALT 25 05/21/2021   AST 26 05/21/2021   ALKPHOS 68 05/21/2021   BILITOT 0.7 05/21/2021   Routine general medical examination at a health care facility We discussed the importance of regular physical activity and healthy diet for prevention of chronic illness and/or complications. Preventive guidelines reviewed. Vaccination updated.  Next CPE in a year. The 10-year ASCVD risk score (Arnett DK, et al., 2019) is: 5.4%   Values used to calculate  the score:     Age: 57 years     Sex: Male     Is Non-Hispanic African American: No     Diabetic: No     Tobacco smoker: No     Systolic Blood Pressure: 945 mmHg     Is BP treated: No     HDL Cholesterol: 48.6 mg/dL     Total Cholesterol: 160 mg/dL  Prostate cancer screening -     PSA  Screening for endocrine, metabolic and immunity disorder -     Hemoglobin A1c -     Comprehensive metabolic panel  LUQ abdominal  pain Hx and examination do not suggest a serious process. Monitor for new symptoms.  Need for shingles vaccine -     Varicella-zoster vaccine IM  Hyperlipidemia, mild Continue Pravastatin 20 mg daily and low fat diet. Further recommendations according to FLP results.  Anxiety disorder Well controlled. Continue Celexa 20 mg 1/2 tab daily. At some point we can try to wean off medication. We discussed some side effects.  Gastroesophageal reflux disease Problem is well controlled. Continue Protonix 20 mg daily and GERD precautions.  Return in 1 year (on 05/21/2022).   Lyric Hoar G. [place], MD  [facility] [place]. [place] office.

## 2021-05-21 ENCOUNTER — Ambulatory Visit (INDEPENDENT_AMBULATORY_CARE_PROVIDER_SITE_OTHER): Payer: BC Managed Care – PPO | Admitting: Family Medicine

## 2021-05-21 ENCOUNTER — Encounter: Payer: Self-pay | Admitting: Family Medicine

## 2021-05-21 VITALS — BP 128/80 | HR 65 | Temp 97.8°F | Resp 16 | Ht 69.0 in | Wt 192.4 lb

## 2021-05-21 DIAGNOSIS — Z23 Encounter for immunization: Secondary | ICD-10-CM

## 2021-05-21 DIAGNOSIS — R1012 Left upper quadrant pain: Secondary | ICD-10-CM

## 2021-05-21 DIAGNOSIS — Z1329 Encounter for screening for other suspected endocrine disorder: Secondary | ICD-10-CM

## 2021-05-21 DIAGNOSIS — E785 Hyperlipidemia, unspecified: Secondary | ICD-10-CM

## 2021-05-21 DIAGNOSIS — Z130 Encounter for screening for diseases of the blood and blood-forming organs and certain disorders involving the immune mechanism: Secondary | ICD-10-CM | POA: Diagnosis not present

## 2021-05-21 DIAGNOSIS — K219 Gastro-esophageal reflux disease without esophagitis: Secondary | ICD-10-CM

## 2021-05-21 DIAGNOSIS — F419 Anxiety disorder, unspecified: Secondary | ICD-10-CM

## 2021-05-21 DIAGNOSIS — Z125 Encounter for screening for malignant neoplasm of prostate: Secondary | ICD-10-CM

## 2021-05-21 DIAGNOSIS — Z0000 Encounter for general adult medical examination without abnormal findings: Secondary | ICD-10-CM | POA: Diagnosis not present

## 2021-05-21 DIAGNOSIS — Z13228 Encounter for screening for other metabolic disorders: Secondary | ICD-10-CM | POA: Diagnosis not present

## 2021-05-21 LAB — COMPREHENSIVE METABOLIC PANEL
ALT: 25 U/L (ref 0–53)
AST: 26 U/L (ref 0–37)
Albumin: 4.6 g/dL (ref 3.5–5.2)
Alkaline Phosphatase: 68 U/L (ref 39–117)
BUN: 15 mg/dL (ref 6–23)
CO2: 27 mEq/L (ref 19–32)
Calcium: 9.4 mg/dL (ref 8.4–10.5)
Chloride: 104 mEq/L (ref 96–112)
Creatinine, Ser: 1.2 mg/dL (ref 0.40–1.50)
GFR: 67.3 mL/min (ref >60.00)
Glucose, Bld: 88 mg/dL (ref 70–99)
Potassium: 4.5 mEq/L (ref 3.5–5.1)
Sodium: 139 mEq/L (ref 135–145)
Total Bilirubin: 0.7 mg/dL (ref 0.2–1.2)
Total Protein: 6.5 g/dL (ref 6.0–8.3)

## 2021-05-21 LAB — LIPID PANEL
Cholesterol: 160 mg/dL (ref 0–200)
HDL: 48.6 mg/dL (ref >39.00)
LDL Cholesterol: 98 mg/dL (ref 0–99)
NonHDL: 111.63
Total CHOL/HDL Ratio: 3
Triglycerides: 67 mg/dL (ref 0.0–149.0)
VLDL: 13.4 mg/dL (ref 0.0–40.0)

## 2021-05-21 LAB — PSA: PSA: 3.61 ng/mL (ref 0.10–4.00)

## 2021-05-21 LAB — CBC
HCT: 44.7 % (ref 39.0–52.0)
Hemoglobin: 15 g/dL (ref 13.0–17.0)
MCHC: 33.6 g/dL (ref 30.0–36.0)
MCV: 88.1 fl (ref 78.0–100.0)
Platelets: 224 10*3/uL (ref 150.0–400.0)
RBC: 5.08 Mil/uL (ref 4.22–5.81)
RDW: 13.6 % (ref 11.5–15.5)
WBC: 5 10*3/uL (ref 4.0–10.5)

## 2021-05-21 LAB — HEMOGLOBIN A1C: Hgb A1c MFr Bld: 6.2 % (ref 4.6–6.5)

## 2021-05-21 MED ORDER — CITALOPRAM HYDROBROMIDE 20 MG PO TABS: 10.0000 mg | ORAL_TABLET | Freq: Every day | ORAL | 3 refills | Status: DC

## 2021-05-21 NOTE — Assessment & Plan Note (Addendum)
Continue Pravastatin 20 mg daily and low fat diet. Further recommendations according to FLP results. 

## 2021-05-21 NOTE — Patient Instructions (Addendum)
A few things to remember from today's visit:  Routine general medical examination at a health care facility  Hyperlipidemia, mild - Plan: Lipid panel  Prostate cancer screening - Plan: PSA  Screening for endocrine, metabolic and immunity disorder - Plan: Comprehensive metabolic panel, Hemoglobin A1c  LUQ abdominal pain - Plan: CBC  Anxiety disorder, unspecified type  If you need refills please call your pharmacy. Do not use My Chart to request refills or for acute issues that need immediate attention.   Please be sure medication list is accurate. If a new problem present, please set up appointment sooner than planned today.  Health Maintenance, Male Adopting a healthy lifestyle and getting preventive care are important in promoting health and wellness. Ask your health care provider about: The right schedule for you to have regular tests and exams. Things you can do on your own to prevent diseases and keep yourself healthy. What should I know about diet, weight, and exercise? Eat a healthy diet  Eat a diet that includes plenty of vegetables, fruits, low-fat dairy products, and lean protein. Do not eat a lot of foods that are high in solid fats, added sugars, or sodium. Maintain a healthy weight Body mass index (BMI) is a measurement that can be used to identify possible weight problems. It estimates body fat based on height and weight. Your health care provider can help determine your BMI and help you achieve or maintain a healthy weight. Get regular exercise Get regular exercise. This is one of the most important things you can do for your health. Most adults should: Exercise for at least 150 minutes each week. The exercise should increase your heart rate and make you sweat (moderate-intensity exercise). Do strengthening exercises at least twice a week. This is in addition to the moderate-intensity exercise. Spend less time sitting. Even light physical activity can be  beneficial. Watch cholesterol and blood lipids Have your blood tested for lipids and cholesterol at 57 years of age, then have this test every 5 years. You may need to have your cholesterol levels checked more often if: Your lipid or cholesterol levels are high. You are older than 57 years of age. You are at high risk for heart disease. What should I know about cancer screening? Many types of cancers can be detected early and may often be prevented. Depending on your health history and family history, you may need to have cancer screening at various ages. This may include screening for: Colorectal cancer. Prostate cancer. Skin cancer. Lung cancer. What should I know about heart disease, diabetes, and high blood pressure? Blood pressure and heart disease High blood pressure causes heart disease and increases the risk of stroke. This is more likely to develop in people who have high blood pressure readings or are overweight. Talk with your health care provider about your target blood pressure readings. Have your blood pressure checked: Every 3-5 years if you are 18-39 years of age. Every year if you are 104 years old or older. If you are between the ages of 40 and 57 and are a current or former smoker, ask your health care provider if you should have a one-time screening for abdominal aortic aneurysm (AAA). Diabetes Have regular diabetes screenings. This checks your fasting blood sugar level. Have the screening done: Once every three years after age 33 if you are at a normal weight and have a low risk for diabetes. More often and at a younger age if you are overweight or have a  high risk for diabetes. What should I know about preventing infection? Hepatitis B If you have a higher risk for hepatitis B, you should be screened for this virus. Talk with your health care provider to find out if you are at risk for hepatitis B infection. Hepatitis C Blood testing is recommended for: Everyone  born from [birth year] through 1967. Anyone with known risk factors for hepatitis C. Sexually transmitted infections (STIs) You should be screened each year for STIs, including gonorrhea and chlamydia, if: You are sexually active and are younger than 57 years of age. You are older than 57 years of age and your health care provider tells you that you are at risk for this type of infection. Your sexual activity has changed since you were last screened, and you are at increased risk for chlamydia or gonorrhea. Ask your health care provider if you are at risk. Ask your health care provider about whether you are at high risk for HIV. Your health care provider may recommend a prescription medicine to help prevent HIV infection. If you choose to take medicine to prevent HIV, you should first get tested for HIV. You should then be tested every 3 months for as long as you are taking the medicine. Follow these instructions at home: Alcohol use Do not drink alcohol if your health care provider tells you not to drink. If you drink alcohol: Limit how much you have to 0-2 drinks a day. Know how much alcohol is in your drink. In the [place] one drink equals one 12 oz bottle of beer (355 mL), one 5 oz glass of wine (148 mL), or one 1 oz glass of hard liquor (44 mL). Lifestyle Do not use any products that contain nicotine or tobacco. These products include cigarettes, chewing tobacco, and vaping devices, such as e-cigarettes. If you need help quitting, ask your health care provider. Do not use street drugs. Do not share needles. Ask your health care provider for help if you need support or information about quitting drugs. General instructions Schedule regular health, dental, and eye exams. Stay current with your vaccines. Tell your health care provider if: You often feel depressed. You have ever been abused or do not feel safe at home. Summary Adopting a healthy lifestyle and getting preventive care are important  in promoting health and wellness. Follow your health care provider's instructions about healthy diet, exercising, and getting tested or screened for diseases. Follow your health care provider's instructions on monitoring your cholesterol and blood pressure. This information is not intended to replace advice given to you by your health care provider. Make sure you discuss any questions you have with your health care provider. Document Revised: 11/13/2020 Document Reviewed: 11/13/2020 Elsevier Patient Education  [place].

## 2021-05-21 NOTE — Assessment & Plan Note (Addendum)
Well controlled. Continue Celexa 20 mg 1/2 tab daily. At some point we can try to wean off medication. We discussed some side effects.

## 2021-05-21 NOTE — Assessment & Plan Note (Signed)
Problem is well controlled. Continue Protonix 20 mg daily and GERD precautions.

## 2021-05-25 IMAGING — MR MRI OF THE RIGHT SHOULDER WITHOUT CONTRAST
5 series · 35 of 40 positions shown · non-contrast
Comparison: None.

CLINICAL DATA: Right shoulder pain for 4 months.  No recent injury.

EXAM:
MRI OF THE RIGHT SHOULDER WITHOUT CONTRAST
TECHNIQUE: Multiplanar, multisequence MR imaging of the shoulder was performed.
No intravenous contrast was administered.

[Series 3: PD fat-sat · axial · 4.0mm · 0.55mm/px · z∈[-18,+65]mm · 9 of 20 slices shown (1 of 2)]
[im 1/20]
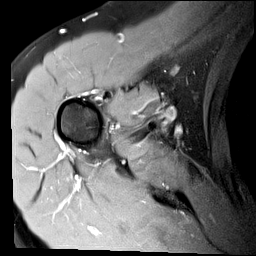
[im 3/20]
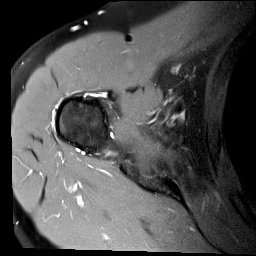
[im 5/20]
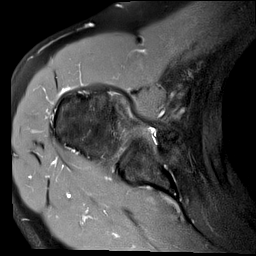
[im 8/20]
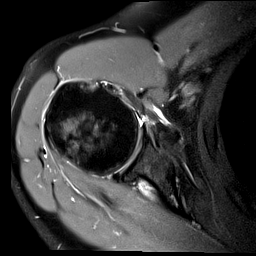
[im 10/20]
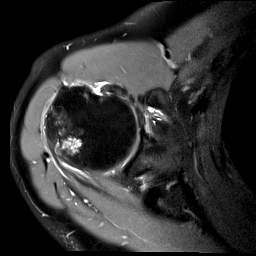
[im 12/20]
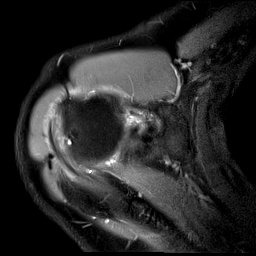
[im 15/20]
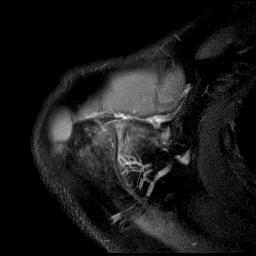
[im 17/20]
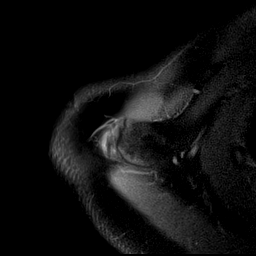
[im 20/20]
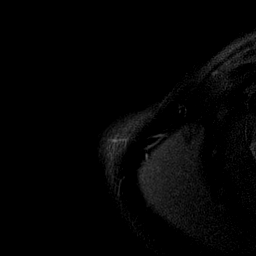

[Series 4: T2 fat-sat · oblique · 4.0mm · 0.55mm/px · 7 of 16 slices shown (1 of 2)]
[im 1/16]
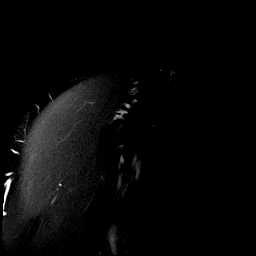
[im 3/16]
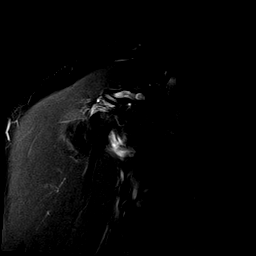
[im 6/16]
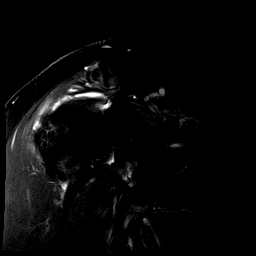
[im 8/16]
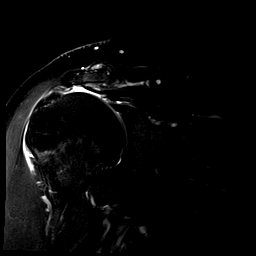
[im 11/16]
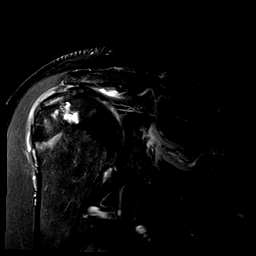
[im 13/16]
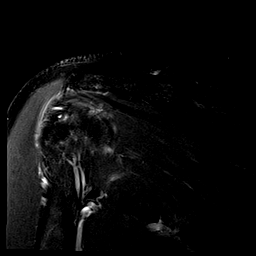
[im 16/16]
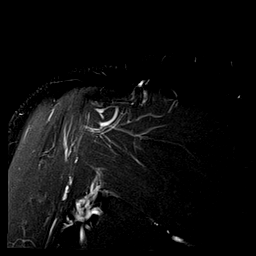

[Series 5: PD fat-sat · oblique · 4.0mm · 0.27mm/px · 8 of 18 slices shown (2 of 2)]
[im 1/18]
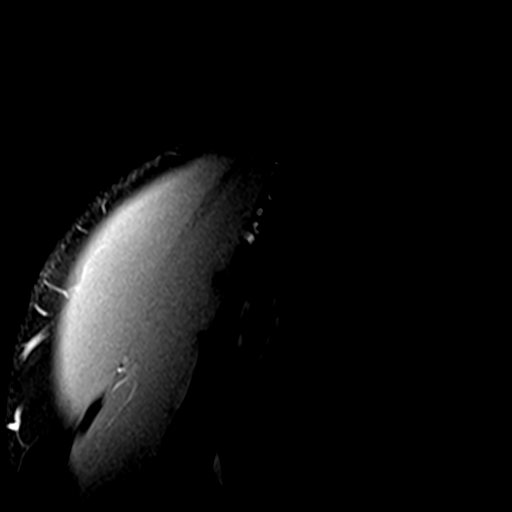
[im 3/18]
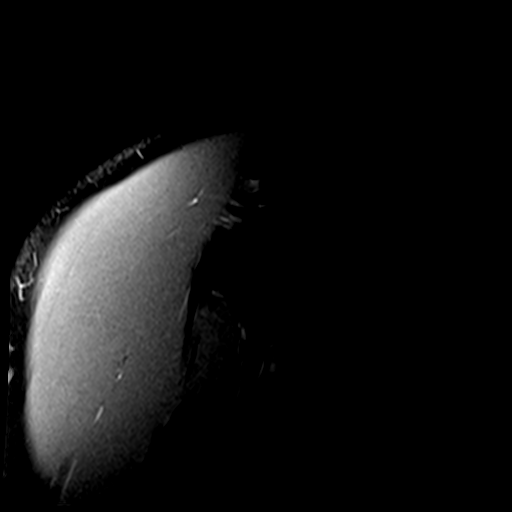
[im 5/18]
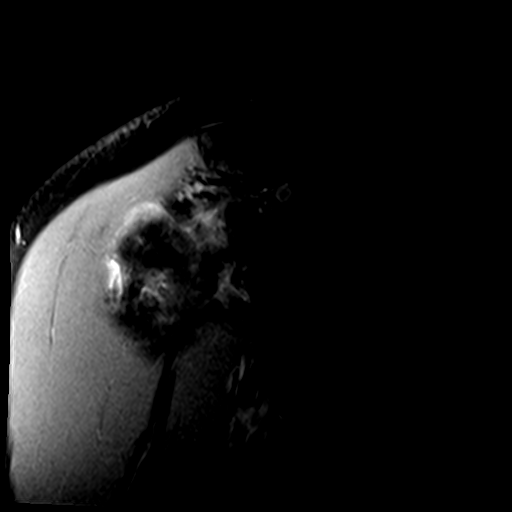
[im 8/18]
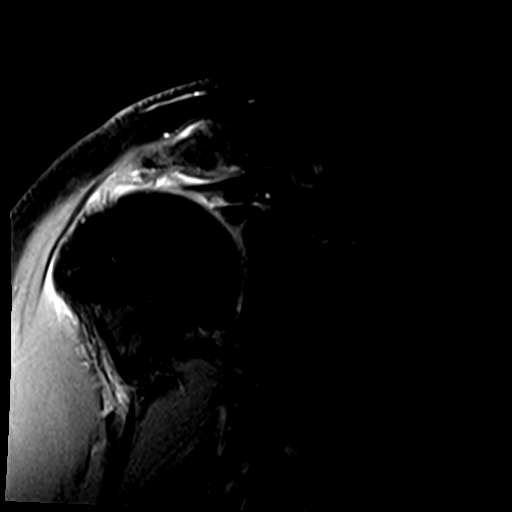
[im 10/18]
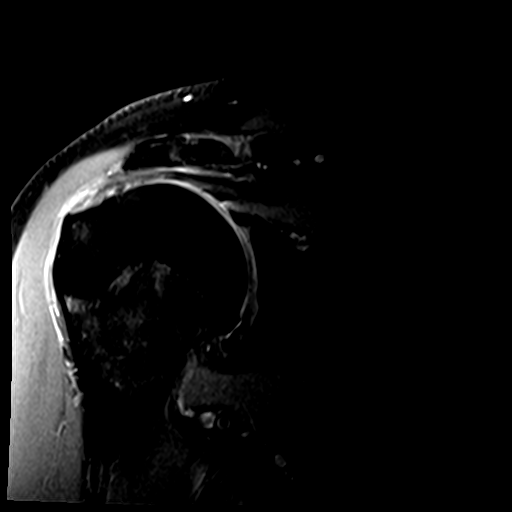
[im 13/18]
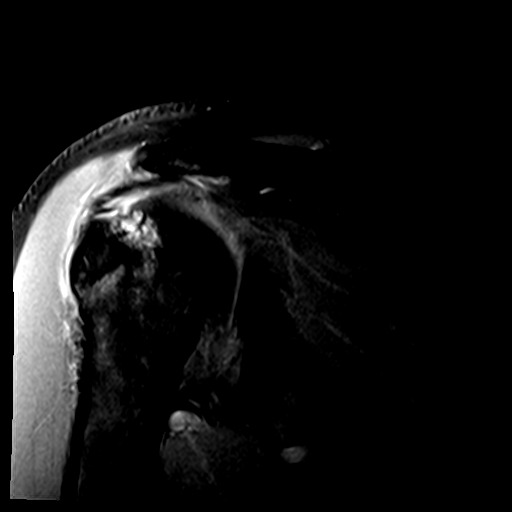
[im 15/18]
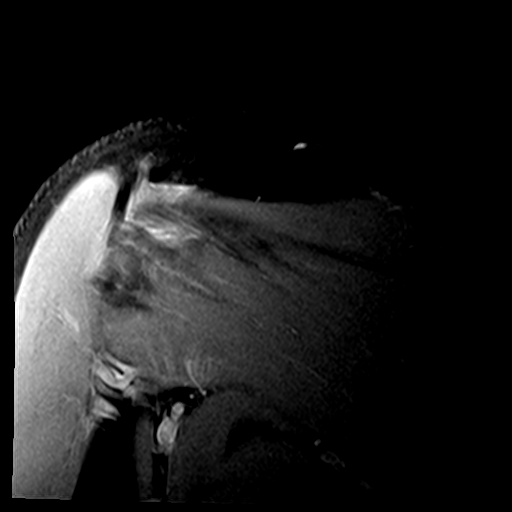
[im 18/18]
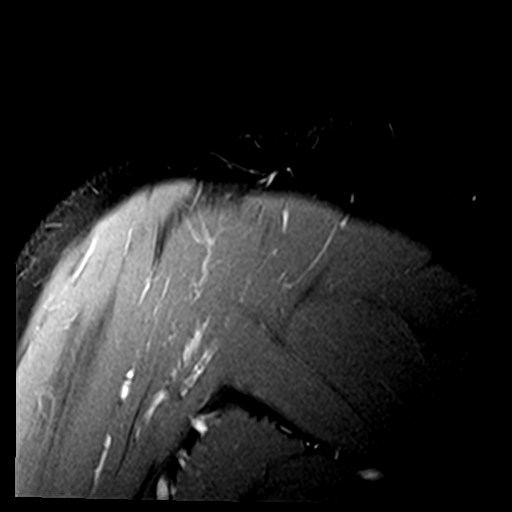

[Series 6: T1 · oblique · 4.0mm · 0.27mm/px · 3 of 18 slices shown]
[im 1/18]
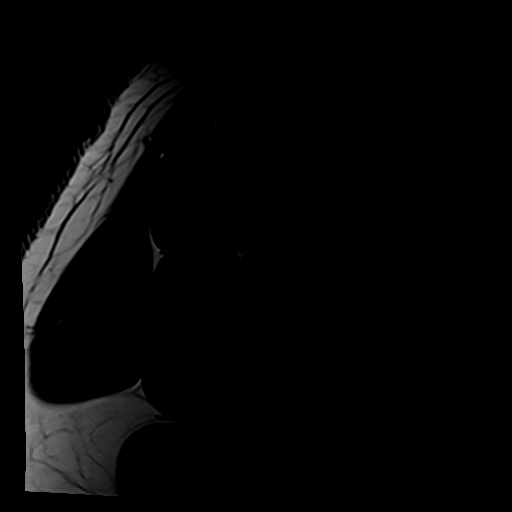
[im 3/18]
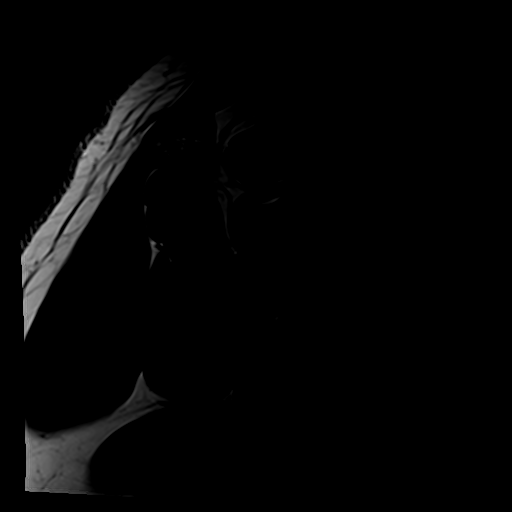
[im 5/18]
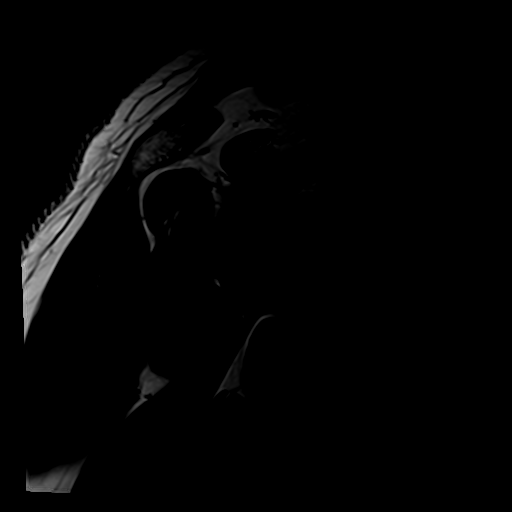

[Series 7: T2 fat-sat · oblique · 4.0mm · 0.55mm/px · 8 of 18 slices shown (2 of 2)]
[im 1/18]
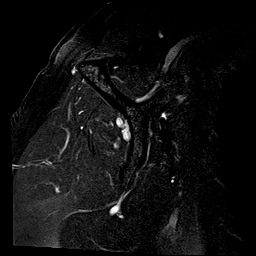
[im 3/18]
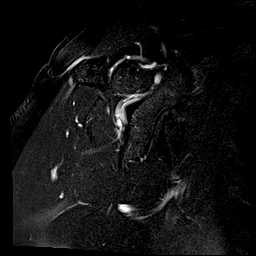
[im 5/18]
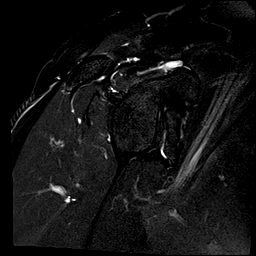
[im 8/18]
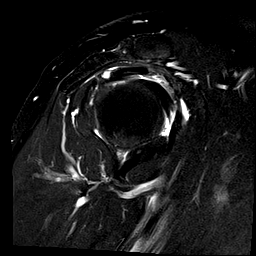
[im 10/18]
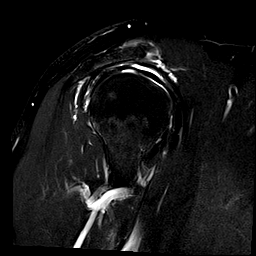
[im 13/18]
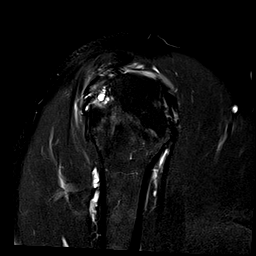
[im 15/18]
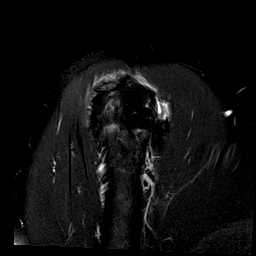
[im 18/18]
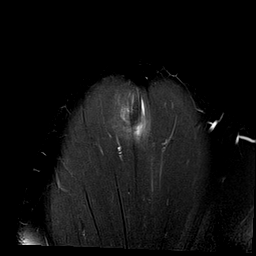

[35 of 40 positions shown; findings below may reference images not displayed]

FINDINGS: Rotator cuff: The patient has a near complete supraspinatus tendon
tear. Only a small band of the posterior most fibers of the tendon
are intact. Retraction is just medial to the top of the humeral
head, 3-4 cm. A large partial width, full-thickness tear of the
subscapularis with 1-2 cm of retraction is also seen. Only a small
band of the inferior most fibers of the tendon is intact. There is
tendinopathy of the infraspinatus in the tendon is markedly thinned
without focal tear.

Muscles: Supraspinatus and infraspinatus are preserved. Moderately
severe subscapularis atrophy is present.

Biceps long head: Intact. The tendon is medially subluxed out of the
bicipital groove into the glenohumeral joint due to the patient's
subscapularis tear.

Acromioclavicular Joint: Moderate osteoarthritis. Type 2 acromion.
Fluid is present in the subacromial/subdeltoid bursa.

Glenohumeral Joint: Preserved.

Labrum:  Appears intact.

Bones:  No fracture or worrisome lesion.

Other: None.
IMPRESSION: Near complete supraspinatus and subscapularis tendon tears. There is
3-4 cm of supraspinatus retraction 1-2 cm of subscapularis
retraction. The supraspinatus muscle belly is preserved. Moderately
severe subscapularis atrophy is present.

Markedly thinned infraspinatus tendon without focal tear identified.

Intact long head of biceps is medially dislocated out of the
bicipital groove due to the patient's subscapularis tear.

Moderate acromioclavicular osteoarthritis.

Subacromial/subdeltoid bursitis.

## 2021-06-05 ENCOUNTER — Other Ambulatory Visit (HOSPITAL_COMMUNITY): Payer: Self-pay

## 2021-06-05 ENCOUNTER — Other Ambulatory Visit: Payer: Self-pay | Admitting: Family Medicine

## 2021-06-05 DIAGNOSIS — B001 Herpesviral vesicular dermatitis: Secondary | ICD-10-CM

## 2021-06-05 MED ORDER — VALACYCLOVIR HCL 1 G PO TABS
ORAL_TABLET | ORAL | 1 refills | Status: DC
  Filled 2021-06-05: qty 20, 10d supply, fill #0
  Filled 2021-08-20: qty 20, 10d supply, fill #1

## 2021-06-19 ENCOUNTER — Other Ambulatory Visit (HOSPITAL_COMMUNITY): Payer: Self-pay

## 2021-06-19 MED FILL — Pantoprazole Sodium EC Tab 20 MG (Base Equiv): ORAL | 90 days supply | Qty: 90 | Fill #1 | Status: AC

## 2021-06-26 DIAGNOSIS — D2262 Melanocytic nevi of left upper limb, including shoulder: Secondary | ICD-10-CM | POA: Diagnosis not present

## 2021-06-26 DIAGNOSIS — Z85828 Personal history of other malignant neoplasm of skin: Secondary | ICD-10-CM | POA: Diagnosis not present

## 2021-06-26 DIAGNOSIS — L910 Hypertrophic scar: Secondary | ICD-10-CM | POA: Diagnosis not present

## 2021-06-26 DIAGNOSIS — D2261 Melanocytic nevi of right upper limb, including shoulder: Secondary | ICD-10-CM | POA: Diagnosis not present

## 2021-07-12 ENCOUNTER — Other Ambulatory Visit: Payer: Self-pay | Admitting: Family Medicine

## 2021-07-12 ENCOUNTER — Other Ambulatory Visit (HOSPITAL_COMMUNITY): Payer: Self-pay

## 2021-07-12 DIAGNOSIS — F419 Anxiety disorder, unspecified: Secondary | ICD-10-CM

## 2021-07-12 MED ORDER — CITALOPRAM HYDROBROMIDE 20 MG PO TABS
10.0000 mg | ORAL_TABLET | Freq: Every day | ORAL | 3 refills | Status: DC
  Filled 2021-07-12: qty 45, 90d supply, fill #0
  Filled 2021-10-02: qty 45, 90d supply, fill #1
  Filled 2021-12-17: qty 45, 90d supply, fill #2
  Filled 2022-03-06: qty 45, 90d supply, fill #3

## 2021-07-16 ENCOUNTER — Ambulatory Visit (INDEPENDENT_AMBULATORY_CARE_PROVIDER_SITE_OTHER): Payer: BC Managed Care – PPO

## 2021-07-16 DIAGNOSIS — Z23 Encounter for immunization: Secondary | ICD-10-CM | POA: Diagnosis not present

## 2021-07-16 NOTE — Progress Notes (Signed)
Patient came in for his 2nd shingles vaccine. Vaccine given in the left deltoid, and patient tolerated injection well.

## 2021-07-24 ENCOUNTER — Other Ambulatory Visit (HOSPITAL_COMMUNITY): Payer: Self-pay

## 2021-08-20 ENCOUNTER — Other Ambulatory Visit (HOSPITAL_COMMUNITY): Payer: Self-pay

## 2021-10-02 ENCOUNTER — Other Ambulatory Visit (HOSPITAL_COMMUNITY): Payer: Self-pay

## 2021-10-22 ENCOUNTER — Other Ambulatory Visit (HOSPITAL_COMMUNITY): Payer: Self-pay

## 2021-10-22 ENCOUNTER — Other Ambulatory Visit: Payer: Self-pay | Admitting: Family Medicine

## 2021-10-22 DIAGNOSIS — K219 Gastro-esophageal reflux disease without esophagitis: Secondary | ICD-10-CM

## 2021-10-22 MED ORDER — PANTOPRAZOLE SODIUM 20 MG PO TBEC
DELAYED_RELEASE_TABLET | Freq: Every day | ORAL | 2 refills | Status: DC
  Filled 2021-10-22: qty 90, 90d supply, fill #0
  Filled 2022-03-01: qty 90, 90d supply, fill #1

## 2021-10-23 ENCOUNTER — Other Ambulatory Visit (HOSPITAL_COMMUNITY): Payer: Self-pay

## 2021-12-17 ENCOUNTER — Other Ambulatory Visit: Payer: Self-pay | Admitting: Family Medicine

## 2021-12-17 ENCOUNTER — Other Ambulatory Visit (HOSPITAL_COMMUNITY): Payer: Self-pay

## 2021-12-17 DIAGNOSIS — B001 Herpesviral vesicular dermatitis: Secondary | ICD-10-CM

## 2021-12-17 MED ORDER — VALACYCLOVIR HCL 1 G PO TABS
ORAL_TABLET | ORAL | 1 refills | Status: DC
  Filled 2021-12-17: qty 20, 5d supply, fill #0
  Filled 2022-02-13: qty 20, 5d supply, fill #1

## 2021-12-26 DIAGNOSIS — L821 Other seborrheic keratosis: Secondary | ICD-10-CM | POA: Diagnosis not present

## 2021-12-26 DIAGNOSIS — Z85828 Personal history of other malignant neoplasm of skin: Secondary | ICD-10-CM | POA: Diagnosis not present

## 2021-12-26 DIAGNOSIS — D2261 Melanocytic nevi of right upper limb, including shoulder: Secondary | ICD-10-CM | POA: Diagnosis not present

## 2021-12-26 DIAGNOSIS — D225 Melanocytic nevi of trunk: Secondary | ICD-10-CM | POA: Diagnosis not present

## 2022-01-18 ENCOUNTER — Other Ambulatory Visit (HOSPITAL_COMMUNITY): Payer: Self-pay

## 2022-01-18 ENCOUNTER — Other Ambulatory Visit: Payer: Self-pay | Admitting: Family Medicine

## 2022-01-18 DIAGNOSIS — E785 Hyperlipidemia, unspecified: Secondary | ICD-10-CM

## 2022-01-18 MED ORDER — PRAVASTATIN SODIUM 20 MG PO TABS
ORAL_TABLET | ORAL | 1 refills | Status: DC
  Filled 2022-01-18: qty 135, 90d supply, fill #0
  Filled 2022-04-19: qty 135, 90d supply, fill #1

## 2022-02-13 ENCOUNTER — Other Ambulatory Visit (HOSPITAL_COMMUNITY): Payer: Self-pay

## 2022-03-01 ENCOUNTER — Other Ambulatory Visit (HOSPITAL_COMMUNITY): Payer: Self-pay

## 2022-03-06 ENCOUNTER — Other Ambulatory Visit (HOSPITAL_COMMUNITY): Payer: Self-pay

## 2022-03-21 ENCOUNTER — Other Ambulatory Visit (HOSPITAL_COMMUNITY): Payer: Self-pay

## 2022-03-22 ENCOUNTER — Other Ambulatory Visit (HOSPITAL_COMMUNITY): Payer: Self-pay

## 2022-04-09 ENCOUNTER — Other Ambulatory Visit (HOSPITAL_COMMUNITY): Payer: Self-pay

## 2022-04-09 ENCOUNTER — Other Ambulatory Visit: Payer: Self-pay | Admitting: Family Medicine

## 2022-04-09 DIAGNOSIS — B001 Herpesviral vesicular dermatitis: Secondary | ICD-10-CM

## 2022-04-09 MED ORDER — VALACYCLOVIR HCL 1 G PO TABS
ORAL_TABLET | ORAL | 1 refills | Status: DC
  Filled 2022-04-09: qty 20, 5d supply, fill #0

## 2022-04-10 ENCOUNTER — Other Ambulatory Visit (HOSPITAL_COMMUNITY): Payer: Self-pay

## 2022-04-16 ENCOUNTER — Telehealth: Payer: BC Managed Care – PPO | Admitting: Nurse Practitioner

## 2022-04-16 DIAGNOSIS — U071 COVID-19: Secondary | ICD-10-CM

## 2022-04-16 NOTE — Progress Notes (Signed)
Virtual Visit Consent   Spero Curb, you are scheduled for a virtual visit with a [facility] provider today. Just as with appointments in the office, your consent must be obtained to participate. Your consent will be active for this visit and any virtual visit you may have with one of our providers in the next 365 days. If you have a MyChart account, a copy of this consent can be sent to you electronically.  As this is a virtual visit, video technology does not allow for your provider to perform a traditional examination. This may limit your provider's ability to fully assess your condition. If your provider identifies any concerns that need to be evaluated in person or the need to arrange testing (such as labs, EKG, etc.), we will make arrangements to do so. Although advances in technology are sophisticated, we cannot ensure that it will always work on either your end or our end. If the connection with a video visit is poor, the visit may have to be switched to a telephone visit. With either a video or telephone visit, we are not always able to ensure that we have a secure connection.  By engaging in this virtual visit, you consent to the provision of healthcare and authorize for your insurance to be billed (if applicable) for the services provided during this visit. Depending on your insurance coverage, you may receive a charge related to this service.  I need to obtain your verbal consent now. Are you willing to proceed with your visit today? Gary [name] has provided verbal consent on 04/16/2022 for a virtual visit (video or telephone). Apolonio Schneiders, FNP  Date: 04/16/2022 10:39 AM  Virtual Visit via Video Note   I, Apolonio Schneiders, connected with  Gary [name]  ([id number], 25-Apr-1966) on 04/16/22 at 11:00 AM EDT by a video-enabled telemedicine application and verified that I am speaking with the correct person using two identifiers.  Location: Patient: Virtual Visit Location Patient:  Home Provider: Virtual Visit Location Provider: Home Office   I discussed the limitations of evaluation and management by telemedicine and the availability of in person appointments. The patient expressed understanding and agreed to proceed.    History of Present Illness: Gary [name] is a 56 y.o. who identifies as a male who was assigned male at birth, and is being seen today after testing positive for COVID today.   Started to have more sinus congestion over the past weekend  Started a low grade fever this morning and had chills this am.    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   Obesity with body mass index (BMI) of 30.0 to 39.9 10/26/2018   Family history of prostate cancer 11/04/2017   Personal history of colonic polyps 04/25/2015   History of melanoma 04/25/2015   Routine general medical examination at a health care facility 04/11/2014   Family history of colonic polyps 12/24/2012   Gastroesophageal reflux disease 07/15/2011   Hernia, hiatal 07/15/2011   Anxiety disorder 06/27/2011   PANIC ATTACK 02/10/2009   Hyperlipidemia, mild 04/13/2007    Allergies: No Known Allergies Medications:  Current Outpatient Medications:    aspirin 81 MG tablet, Take 81 mg by mouth daily., Disp: , Rfl:  betamethasone dipropionate 0.05 % cream, Apply topically 2 (two) times daily., Disp: 30 g, Rfl: 1   citalopram (CELEXA) 20 MG tablet, Take 1/2 tablet (10 mg total) by mouth daily., Disp: 45 tablet, Rfl: 3   pantoprazole (PROTONIX) 20 MG tablet, TAKE 1 TABLET BY MOUTH ONCE A DAY, Disp: 90 tablet, Rfl: 2   pravastatin (PRAVACHOL) 20 MG tablet, TAKE 1 AND 1/2 TABLETS BY MOUTH ONCE A DAY, Disp: 135 tablet, Rfl: 1   valACYclovir (VALTREX) 1000 MG tablet, TAKE 2 TABLETS BY  MOUTH AT ONSET OF FEVER BLISTERS AND REPEAT IN 12 HOURS FOR 1 DAY AS NEEDED, Disp: 20 tablet, Rfl: 1  Observations/Objective: Patient is well-developed, well-nourished in no acute distress.  Resting comfortably  at home.  Head is normocephalic, atraumatic.  No labored breathing.  Speech is clear and coherent with logical content.  Patient is alert and oriented at baseline.    Assessment and Plan: 1. COVID-19 Advised management with OTC medication, overall low risk.  Discussed isolation precautions Advised follow up if symptoms persist or with new concerns      Follow Up Instructions: I discussed the assessment and treatment plan with the patient. The patient was provided an opportunity to ask questions and all were answered. The patient agreed with the plan and demonstrated an understanding of the instructions.  A copy of instructions were sent to the patient via MyChart unless otherwise noted below.    The patient was advised to call back or seek an in-person evaluation if the symptoms worsen or if the condition fails to improve as anticipated.  Time:  I spent 10 minutes with the patient via telehealth technology discussing the above problems/concerns.    Apolonio Schneiders, FNP

## 2022-04-19 ENCOUNTER — Other Ambulatory Visit (HOSPITAL_COMMUNITY): Payer: Self-pay

## 2022-06-12 NOTE — Progress Notes (Unsigned)
HPI: [name] is a 56 y.o.male with past medical history of hyperlipidemia, prediabetes, anxiety, and GERD here today for his routine physical examination.  Last CPE: 05/21/21 He reports no new problems since his last visit. He maintains a generally healthy diet and has successfully kept his weight stable. He recently visited [place] and while there ate a lot of red meat and pastries. He exercises regularly, walking at least five times per week and averaging almost 15,000 steps per day. He sleeps an average of seven to eight hours per night. Former smoker.  Immunization History  Administered Date(s) Administered   Influenza Split 03/28/2014   Influenza Whole 04/07/2010   Influenza-Unspecified 04/11/2015, 02/14/2016   PFIZER Comirnaty(Gray Top)Covid-19 Tri-Sucrose Vaccine 05/05/2020   PFIZER(Purple Top)SARS-COV-2 Vaccination 07/08/2019, 08/08/2019, 06/06/2020   Td 07/08/1998, 03/01/2009   Tdap 05/22/2020   Zoster Recombinat (Shingrix) 05/21/2021, 07/16/2021   Health Maintenance  Topic Date Due   COVID-19 Vaccine (5 - 2023-24 season) 06/30/2022 (Originally 03/08/2022)   INFLUENZA VACCINE  10/06/2022 (Originally 02/05/2022)   HIV Screening  05/21/2029 (Originally 03/27/1979)   COLONOSCOPY (Pts 45-19yr Insurance coverage will need to be confirmed)  06/22/2024   DTaP/Tdap/Td (4 - Td or Tdap) 05/22/2030   Hepatitis C Screening  Completed   Zoster Vaccines- Shingrix  Completed   HPV VACCINES  Aged Out   Last prostate ca screening: Nocturia x 0. Lab Results  Component Value Date   PSA 3.61 05/21/2021   PSA 2.82 05/22/2020   PSA 3.47 04/19/2019   Hyperlipidemia: Currently he is on pravastatin 20 mg daily.  Lab Results  Component Value Date   CHOL 160 05/21/2021   HDL 48.60 05/21/2021   LDLCALC 98 05/21/2021   LDLDIRECT 118.0 09/26/2016   TRIG 67.0 05/21/2021   CHOLHDL 3 05/21/2021   Anxiety on Celexa 20 mg, 1/2 tab daily. He has been taking Celexa for 15 years and has  experienced lightheadedness when attempting to discontinue its use. He doe snot think he needs medication.  GERD on Protonix 20 mg daily.  Review of Systems  Constitutional:  Negative for activity change, appetite change, fatigue and fever.  HENT:  Positive for sneezing. Negative for mouth sores, nosebleeds, sore throat, trouble swallowing and voice change.   Eyes:  Negative for redness and visual disturbance.  Respiratory:  Negative for cough, shortness of breath and wheezing.   Cardiovascular:  Negative for chest pain, palpitations and leg swelling.  Gastrointestinal:  Negative for abdominal pain, blood in stool, nausea and vomiting.  Endocrine: Negative for cold intolerance, heat intolerance, polydipsia, polyphagia and polyuria.  Genitourinary:  Negative for decreased urine volume, dysuria, genital sores, hematuria and testicular pain.  Musculoskeletal:  Negative for gait problem and myalgias.  Skin:  Negative for color change and rash.  Allergic/Immunologic: Positive for environmental allergies.  Neurological:  Negative for syncope, weakness and headaches.  Hematological:  Negative for adenopathy. Does not bruise/bleed easily.  Psychiatric/Behavioral:  Negative for confusion and sleep disturbance. The patient is not nervous/anxious.   All other systems reviewed and are negative.  Current Outpatient Medications on File Prior to Visit  Medication Sig Dispense Refill   aspirin 81 MG tablet Take 81 mg by mouth daily.     betamethasone dipropionate 0.05 % cream Apply topically 2 (two) times daily. 30 g 1   citalopram (CELEXA) 20 MG tablet Take 1/2 tablet (10 mg total) by mouth daily. 45 tablet 3   pantoprazole (PROTONIX) 20 MG tablet TAKE 1 TABLET BY MOUTH ONCE  A DAY 90 tablet 2   pravastatin (PRAVACHOL) 20 MG tablet TAKE 1 AND 1/2 TABLETS BY MOUTH ONCE A DAY 135 tablet 1   valACYclovir (VALTREX) 1000 MG tablet TAKE 2 TABLETS BY MOUTH AT ONSET OF FEVER BLISTERS AND REPEAT IN 12 HOURS FOR 1  DAY AS NEEDED 20 tablet 1   ZETIA 10 MG tablet      No current facility-administered medications on file prior to visit.   Past Medical History:  Diagnosis Date   Allergy    Anxiety    GERD (gastroesophageal reflux disease)    Hiatal hernia 2013   egd   Hyperlipidemia    Past Surgical History:  Procedure Laterality Date   MOHS SURGERY  november 2013   melanoma removed from back   rotater cuff repair Right 2020   TONSILLECTOMY     UPPER GASTROINTESTINAL ENDOSCOPY  2013   hh   No Known Allergies  Family History  Problem Relation Age of Onset   Breast cancer Mother    Uterine cancer Mother    Leukemia Father    Arthritis Other    Hypertension Other    Thyroid disease Other    Uterine cancer Other    Heart disease Other    Crohn's disease Maternal Grandmother    Colon polyps Brother    Colon cancer Other        great maternal grandfather    Colon cancer Other        great uncle    Esophageal cancer Neg Hx    Rectal cancer Neg Hx    Stomach cancer Neg Hx    Social History   Socioeconomic History   Marital status: Married    Spouse name: Not on file   Number of children: 1   Years of education: Not on file   Highest education level: Not on file  Occupational History   Occupation: Chemical engineer: UNEMPLOYED  Tobacco Use   Smoking status: Former    Packs/day: 0.25    Years: 20.00    Total pack years: 5.00    Types: Cigarettes    Quit date: 2016    Years since quitting: 7.9   Smokeless tobacco: Never  Substance and Sexual Activity   Alcohol use: Not Currently    Comment: Drinks a beer once in awhile.  Not often   Drug use: No   Sexual activity: Not on file  Other Topics Concern   Not on file  Social History Narrative   Not on file   Social Determinants of Health   Financial Resource Strain: Not on file  Food Insecurity: Not on file  Transportation Needs: Not on file  Physical Activity: Not on file  Stress: Not on file  Social  Connections: Not on file   Vitals:   06/14/22 1353  BP: 118/70  Pulse: 79  Resp: 12  Temp: 97.9 F (36.6 C)  SpO2: 98%   Body mass index is 28.5 kg/m.  Wt Readings from Last 3 Encounters:  06/14/22 193 lb (87.5 kg)  05/21/21 192 lb 6.4 oz (87.3 kg)  05/22/20 203 lb 12.8 oz (92.4 kg)  Physical Exam Vitals and nursing note reviewed.  Constitutional:      General: He is not in acute distress.    Appearance: He is well-developed.  HENT:     Head: Normocephalic and atraumatic.     Right Ear: Tympanic membrane, ear canal and external ear normal.  Left Ear: Tympanic membrane, ear canal and external ear normal.     Mouth/Throat:     Mouth: Mucous membranes are moist.     Pharynx: Oropharynx is clear.  Eyes:     Extraocular Movements: Extraocular movements intact.     Conjunctiva/sclera: Conjunctivae normal.     Pupils: Pupils are equal, round, and reactive to light.  Neck:     Thyroid: No thyromegaly.     Trachea: No tracheal deviation.  Cardiovascular:     Rate and Rhythm: Normal rate and regular rhythm.     Pulses:          Dorsalis pedis pulses are 2+ on the right side and 2+ on the left side.     Heart sounds: No murmur heard. Pulmonary:     Effort: Pulmonary effort is normal. No respiratory distress.     Breath sounds: Normal breath sounds.  Abdominal:     Palpations: Abdomen is soft. There is no hepatomegaly or mass.     Tenderness: There is no abdominal tenderness.  Genitourinary:    Comments: No concerns. Musculoskeletal:        General: No tenderness.     Cervical back: Normal range of motion.     Comments: No major deformities appreciated and no signs of synovitis.  Lymphadenopathy:     Cervical: No cervical adenopathy.     Upper Body:     Right upper body: No supraclavicular adenopathy.     Left upper body: No supraclavicular adenopathy.  Skin:    General: Skin is warm.     Findings: No erythema.  Neurological:     General: No focal deficit  present.     Mental Status: He is alert and oriented to person, place, and time.     Cranial Nerves: No cranial nerve deficit.     Sensory: No sensory deficit.     Gait: Gait normal.     Deep Tendon Reflexes:     Reflex Scores:      Bicep reflexes are 2+ on the right side and 2+ on the left side.      Patellar reflexes are 2+ on the right side and 2+ on the left side. Psychiatric:        Mood and Affect: Mood and affect normal.   ASSESSMENT AND PLAN:  Mr.Gerold was seen today for annual exam.  Diagnoses and all orders for this visit: Lab Results  Component Value Date   CHOL 217 (H) 06/14/2022   HDL 55.50 06/14/2022   LDLCALC 135 (H) 06/14/2022   LDLDIRECT 118.0 09/26/2016   TRIG 132.0 06/14/2022   CHOLHDL 4 06/14/2022   Lab Results  Component Value Date   CREATININE 1.13 06/14/2022   BUN 13 06/14/2022   NA 137 06/14/2022   K 4.2 06/14/2022   CL 100 06/14/2022   CO2 31 06/14/2022   Lab Results  Component Value Date   ALT 24 06/14/2022   AST 28 06/14/2022   ALKPHOS 61 06/14/2022   BILITOT 0.8 06/14/2022   Lab Results  Component Value Date   HGBA1C 6.2 06/14/2022   Lab Results  Component Value Date   PSA 5.13 (H) 06/14/2022   PSA 3.61 05/21/2021   PSA 2.82 05/22/2020   Routine general medical examination at a health care facility Assessment & Plan: We discussed the importance of regular physical activity and healthy diet for prevention of chronic illness and/or complications. Preventive guidelines reviewed. Vaccination up to date. Next CPE in a year.  Prediabetes Assessment & Plan: Hemoglobin A1c was 6.2 in 05/2021. Continue a healthy lifestyle for diabetes prevention. Further recommendation will be given according to hemoglobin A1c result.  Orders: -     Comprehensive metabolic panel; Future -     Hemoglobin A1c; Future  Hyperlipidemia, mild Assessment & Plan: Continue Pravastatin 20 mg daily and low fat diet. Further recommendations according to  FLP results.  Orders: -     Comprehensive metabolic panel; Future -     Lipid panel; Future  Prostate cancer screening -     PSA  Anxiety disorder, unspecified type Assessment & Plan: Well controlled. He does not think he needs medication, will try to decrease frequency from 5 mg daily to every other day. Instructed about warning signs.  Orders: -     Citalopram Hydrobromide; One tab every other day.  Dispense: 45 tablet; Refill: 3  Elevated PSA -     Ambulatory referral to Urology   Return in 1 year (on 06/15/2023).  Marlys Stegmaier G. [place], MD  [facility]. [place] office.

## 2022-06-14 ENCOUNTER — Encounter: Payer: Self-pay | Admitting: Family Medicine

## 2022-06-14 ENCOUNTER — Ambulatory Visit (INDEPENDENT_AMBULATORY_CARE_PROVIDER_SITE_OTHER): Payer: BC Managed Care – PPO | Admitting: Family Medicine

## 2022-06-14 VITALS — BP 118/70 | HR 79 | Temp 97.9°F | Resp 12 | Ht 69.0 in | Wt 193.0 lb

## 2022-06-14 DIAGNOSIS — R7303 Prediabetes: Secondary | ICD-10-CM

## 2022-06-14 DIAGNOSIS — Z0000 Encounter for general adult medical examination without abnormal findings: Secondary | ICD-10-CM

## 2022-06-14 DIAGNOSIS — E785 Hyperlipidemia, unspecified: Secondary | ICD-10-CM | POA: Diagnosis not present

## 2022-06-14 DIAGNOSIS — Z125 Encounter for screening for malignant neoplasm of prostate: Secondary | ICD-10-CM

## 2022-06-14 DIAGNOSIS — F419 Anxiety disorder, unspecified: Secondary | ICD-10-CM

## 2022-06-14 DIAGNOSIS — R972 Elevated prostate specific antigen [PSA]: Secondary | ICD-10-CM

## 2022-06-14 LAB — COMPREHENSIVE METABOLIC PANEL
ALT: 24 U/L (ref 0–53)
AST: 28 U/L (ref 0–37)
Albumin: 4.8 g/dL (ref 3.5–5.2)
Alkaline Phosphatase: 61 U/L (ref 39–117)
BUN: 13 mg/dL (ref 6–23)
CO2: 31 mEq/L (ref 19–32)
Calcium: 9.4 mg/dL (ref 8.4–10.5)
Chloride: 100 mEq/L (ref 96–112)
Creatinine, Ser: 1.13 mg/dL (ref 0.40–1.50)
GFR: 71.79 mL/min (ref >60.00)
Glucose, Bld: 82 mg/dL (ref 70–99)
Potassium: 4.2 mEq/L (ref 3.5–5.1)
Sodium: 137 mEq/L (ref 135–145)
Total Bilirubin: 0.8 mg/dL (ref 0.2–1.2)
Total Protein: 7 g/dL (ref 6.0–8.3)

## 2022-06-14 LAB — LIPID PANEL
Cholesterol: 217 mg/dL — ABNORMAL HIGH (ref 0–200)
HDL: 55.5 mg/dL (ref >39.00)
LDL Cholesterol: 135 mg/dL — ABNORMAL HIGH (ref 0–99)
NonHDL: 161.44
Total CHOL/HDL Ratio: 4
Triglycerides: 132 mg/dL (ref 0.0–149.0)
VLDL: 26.4 mg/dL (ref 0.0–40.0)

## 2022-06-14 LAB — PSA: PSA: 5.13 ng/mL — ABNORMAL HIGH (ref 0.10–4.00)

## 2022-06-14 LAB — HEMOGLOBIN A1C: Hgb A1c MFr Bld: 6.2 % (ref 4.6–6.5)

## 2022-06-14 MED ORDER — CITALOPRAM HYDROBROMIDE 10 MG PO TABS: ORAL_TABLET | ORAL | 3 refills | Status: DC

## 2022-06-14 NOTE — Assessment & Plan Note (Signed)
Well controlled. He does not think he needs medication, will try to decrease frequency from 5 mg daily to every other day. Instructed about warning signs.

## 2022-06-14 NOTE — Patient Instructions (Addendum)
A few things to remember from today's visit:  Routine general medical examination at a health care facility  Prediabetes - Plan: Comprehensive metabolic panel, Hemoglobin A1c  Hyperlipidemia, mild - Plan: Comprehensive metabolic panel, Lipid panel  Prostate cancer screening - Plan: PSA(Must document that pt has been informed of limitations of PSA testing.) Decrease frequency of Celexa to every other day.  If you need refills for medications you take chronically, please call your pharmacy. Do not use My Chart to request refills or for acute issues that need immediate attention. If you send a my chart message, it may take a few days to be addressed, specially if I am not in the office.  Please be sure medication list is accurate. If a new problem present, please set up appointment sooner than planned today.  Health Maintenance, Male Adopting a healthy lifestyle and getting preventive care are important in promoting health and wellness. Ask your health care provider about: The right schedule for you to have regular tests and exams. Things you can do on your own to prevent diseases and keep yourself healthy. What should I know about diet, weight, and exercise? Eat a healthy diet  Eat a diet that includes plenty of vegetables, fruits, low-fat dairy products, and lean protein. Do not eat a lot of foods that are high in solid fats, added sugars, or sodium. Maintain a healthy weight Body mass index (BMI) is a measurement that can be used to identify possible weight problems. It estimates body fat based on height and weight. Your health care provider can help determine your BMI and help you achieve or maintain a healthy weight. Get regular exercise Get regular exercise. This is one of the most important things you can do for your health. Most adults should: Exercise for at least 150 minutes each week. The exercise should increase your heart rate and make you sweat (moderate-intensity  exercise). Do strengthening exercises at least twice a week. This is in addition to the moderate-intensity exercise. Spend less time sitting. Even light physical activity can be beneficial. Watch cholesterol and blood lipids Have your blood tested for lipids and cholesterol at 58 years of age, then have this test every 5 years. You may need to have your cholesterol levels checked more often if: Your lipid or cholesterol levels are high. You are older than 58 years of age. You are at high risk for heart disease. What should I know about cancer screening? Many types of cancers can be detected early and may often be prevented. Depending on your health history and family history, you may need to have cancer screening at various ages. This may include screening for: Colorectal cancer. Prostate cancer. Skin cancer. Lung cancer. What should I know about heart disease, diabetes, and high blood pressure? Blood pressure and heart disease High blood pressure causes heart disease and increases the risk of stroke. This is more likely to develop in people who have high blood pressure readings or are overweight. Talk with your health care provider about your target blood pressure readings. Have your blood pressure checked: Every 3-5 years if you are 22-48 years of age. Every year if you are 43 years old or older. If you are between the ages of 61 and 10 and are a current or former smoker, ask your health care provider if you should have a one-time screening for abdominal aortic aneurysm (AAA). Diabetes Have regular diabetes screenings. This checks your fasting blood sugar level. Have the screening done: Once every three  years after age 20 if you are at a normal weight and have a low risk for diabetes. More often and at a younger age if you are overweight or have a high risk for diabetes. What should I know about preventing infection? Hepatitis B If you have a higher risk for hepatitis B, you should be  screened for this virus. Talk with your health care provider to find out if you are at risk for hepatitis B infection. Hepatitis C Blood testing is recommended for: Everyone born from [birth year] through 1967. Anyone with known risk factors for hepatitis C. Sexually transmitted infections (STIs) You should be screened each year for STIs, including gonorrhea and chlamydia, if: You are sexually active and are younger than 58 years of age. You are older than 58 years of age and your health care provider tells you that you are at risk for this type of infection. Your sexual activity has changed since you were last screened, and you are at increased risk for chlamydia or gonorrhea. Ask your health care provider if you are at risk. Ask your health care provider about whether you are at high risk for HIV. Your health care provider may recommend a prescription medicine to help prevent HIV infection. If you choose to take medicine to prevent HIV, you should first get tested for HIV. You should then be tested every 3 months for as long as you are taking the medicine. Follow these instructions at home: Alcohol use Do not drink alcohol if your health care provider tells you not to drink. If you drink alcohol: Limit how much you have to 0-2 drinks a day. Know how much alcohol is in your drink. In the [place] one drink equals one 12 oz bottle of beer (355 mL), one 5 oz glass of wine (148 mL), or one 1 oz glass of hard liquor (44 mL). Lifestyle Do not use any products that contain nicotine or tobacco. These products include cigarettes, chewing tobacco, and vaping devices, such as e-cigarettes. If you need help quitting, ask your health care provider. Do not use street drugs. Do not share needles. Ask your health care provider for help if you need support or information about quitting drugs. General instructions Schedule regular health, dental, and eye exams. Stay current with your vaccines. Tell your health care  provider if: You often feel depressed. You have ever been abused or do not feel safe at home. Summary Adopting a healthy lifestyle and getting preventive care are important in promoting health and wellness. Follow your health care provider's instructions about healthy diet, exercising, and getting tested or screened for diseases. Follow your health care provider's instructions on monitoring your cholesterol and blood pressure. This information is not intended to replace advice given to you by your health care provider. Make sure you discuss any questions you have with your health care provider. Document Revised: 11/13/2020 Document Reviewed: 11/13/2020 Elsevier Patient Education  [employer].

## 2022-06-14 NOTE — Assessment & Plan Note (Signed)
Continue Pravastatin 20 mg daily and low fat diet. Further recommendations according to FLP results.

## 2022-06-15 NOTE — Assessment & Plan Note (Signed)
Hemoglobin A1c was 6.2 in 05/2021. Continue a healthy lifestyle for diabetes prevention. Further recommendation will be given according to hemoglobin A1c result.

## 2022-06-15 NOTE — Assessment & Plan Note (Signed)
We discussed the importance of regular physical activity and healthy diet for prevention of chronic illness and/or complications. Preventive guidelines reviewed. Vaccination up to date. Next CPE in a year. 

## 2022-06-20 ENCOUNTER — Encounter: Payer: Self-pay | Admitting: Family Medicine

## 2022-06-24 ENCOUNTER — Other Ambulatory Visit: Payer: Self-pay | Admitting: Family Medicine

## 2022-06-24 ENCOUNTER — Other Ambulatory Visit (HOSPITAL_COMMUNITY): Payer: Self-pay

## 2022-06-24 DIAGNOSIS — Z0189 Encounter for other specified special examinations: Secondary | ICD-10-CM

## 2022-06-24 DIAGNOSIS — F419 Anxiety disorder, unspecified: Secondary | ICD-10-CM

## 2022-06-25 ENCOUNTER — Other Ambulatory Visit (HOSPITAL_COMMUNITY): Payer: Self-pay

## 2022-06-25 ENCOUNTER — Other Ambulatory Visit: Payer: Self-pay | Admitting: Family Medicine

## 2022-06-25 DIAGNOSIS — F419 Anxiety disorder, unspecified: Secondary | ICD-10-CM

## 2022-06-26 ENCOUNTER — Other Ambulatory Visit (HOSPITAL_COMMUNITY): Payer: Self-pay

## 2022-06-27 ENCOUNTER — Other Ambulatory Visit (HOSPITAL_COMMUNITY): Payer: Self-pay

## 2022-06-28 ENCOUNTER — Other Ambulatory Visit (HOSPITAL_COMMUNITY): Payer: Self-pay

## 2022-06-28 DIAGNOSIS — Z85828 Personal history of other malignant neoplasm of skin: Secondary | ICD-10-CM | POA: Diagnosis not present

## 2022-06-28 DIAGNOSIS — D225 Melanocytic nevi of trunk: Secondary | ICD-10-CM | POA: Diagnosis not present

## 2022-06-28 DIAGNOSIS — D2261 Melanocytic nevi of right upper limb, including shoulder: Secondary | ICD-10-CM | POA: Diagnosis not present

## 2022-06-28 DIAGNOSIS — L821 Other seborrheic keratosis: Secondary | ICD-10-CM | POA: Diagnosis not present

## 2022-07-11 ENCOUNTER — Other Ambulatory Visit (HOSPITAL_COMMUNITY): Payer: Self-pay

## 2022-07-11 MED ORDER — LEVOFLOXACIN 750 MG PO TABS
ORAL_TABLET | ORAL | 0 refills | Status: DC
  Filled 2022-07-11: qty 1, 1d supply, fill #0

## 2022-07-18 ENCOUNTER — Other Ambulatory Visit: Payer: Self-pay | Admitting: Urology

## 2022-07-18 DIAGNOSIS — Z125 Encounter for screening for malignant neoplasm of prostate: Secondary | ICD-10-CM

## 2022-07-18 DIAGNOSIS — R972 Elevated prostate specific antigen [PSA]: Secondary | ICD-10-CM

## 2022-07-19 ENCOUNTER — Telehealth: Payer: Self-pay | Admitting: Family Medicine

## 2022-07-19 ENCOUNTER — Other Ambulatory Visit (HOSPITAL_COMMUNITY): Payer: Self-pay

## 2022-07-19 DIAGNOSIS — B001 Herpesviral vesicular dermatitis: Secondary | ICD-10-CM

## 2022-07-19 DIAGNOSIS — K219 Gastro-esophageal reflux disease without esophagitis: Secondary | ICD-10-CM

## 2022-07-19 DIAGNOSIS — F419 Anxiety disorder, unspecified: Secondary | ICD-10-CM

## 2022-07-19 DIAGNOSIS — E785 Hyperlipidemia, unspecified: Secondary | ICD-10-CM

## 2022-07-19 MED ORDER — CITALOPRAM HYDROBROMIDE 10 MG PO TABS
10.0000 mg | ORAL_TABLET | ORAL | 3 refills | Status: DC
  Filled 2022-07-19 – 2022-09-20 (×2): qty 45, 90d supply, fill #0
  Filled 2022-12-16: qty 45, 90d supply, fill #1
  Filled 2023-02-20 – 2023-02-21 (×2): qty 45, 90d supply, fill #2
  Filled 2023-05-26: qty 45, 90d supply, fill #3

## 2022-07-19 MED ORDER — PANTOPRAZOLE SODIUM 20 MG PO TBEC
20.0000 mg | DELAYED_RELEASE_TABLET | Freq: Every day | ORAL | 2 refills | Status: DC
  Filled 2022-07-19: qty 90, 90d supply, fill #0
  Filled 2022-11-14: qty 90, 90d supply, fill #1
  Filled 2023-02-26: qty 90, 90d supply, fill #2

## 2022-07-19 MED ORDER — PRAVASTATIN SODIUM 20 MG PO TABS
30.0000 mg | ORAL_TABLET | Freq: Every day | ORAL | 1 refills | Status: DC
  Filled 2022-07-19: qty 135, 90d supply, fill #0
  Filled 2022-10-16: qty 135, 90d supply, fill #1

## 2022-07-19 MED ORDER — VALACYCLOVIR HCL 1 G PO TABS
ORAL_TABLET | ORAL | 1 refills | Status: DC
  Filled 2022-07-19: qty 20, 5d supply, fill #0
  Filled 2022-11-14: qty 20, 5d supply, fill #1

## 2022-07-19 NOTE — Telephone Encounter (Signed)
Rxs sent

## 2022-07-19 NOTE — Telephone Encounter (Signed)
Pt is calling and would like refill on citalopram (CELEXA) 10 MG table , pantoprazole (PROTONIX) 20 MG tablet , pravastatin (PRAVACHOL) 20 MG tablet , valACYclovir (VALTREX) 1000 MG tablet  and  Tensed Phone: [phone]  Fax: [phone]

## 2022-08-22 ENCOUNTER — Ambulatory Visit
Admission: RE | Admit: 2022-08-22 | Discharge: 2022-08-22 | Disposition: A | Discharge status: Home / Self Care | Payer: No Typology Code available for payment source | Source: Ambulatory Visit | Attending: Urology | Admitting: Urology

## 2022-08-22 DIAGNOSIS — Z125 Encounter for screening for malignant neoplasm of prostate: Secondary | ICD-10-CM

## 2022-08-22 DIAGNOSIS — R972 Elevated prostate specific antigen [PSA]: Secondary | ICD-10-CM

## 2022-08-22 MED ORDER — GADOPICLENOL 0.5 MMOL/ML IV SOLN
9.0000 mL | Freq: Once | INTRAVENOUS | Status: AC | PRN
  Administered 2022-08-22: 9 mL via INTRAVENOUS

## 2022-09-20 ENCOUNTER — Other Ambulatory Visit (HOSPITAL_COMMUNITY): Payer: Self-pay

## 2022-10-09 NOTE — Progress Notes (Signed)
GU Location of Tumor / Histology: Prostate Ca  If Prostate Cancer, Gleason Score is (3 + 4) and PSA is (4.54 on 07/11/2022)  Biopsies      Past/Anticipated interventions by urology, if any:  No    Past/Anticipated interventions by medical oncology, if any: NA  Weight changes, if any:  No  IPSS:  1 SHIM:  25  Bowel/Bladder complaints, if any:  No  Nausea/Vomiting, if any: No  Pain issues, if any:  0/10  SAFETY ISSUES: Prior radiation? No Pacemaker/ICD? No Possible current pregnancy? Male Is the patient on methotrexate? No  Current Complaints / other details:  Request to see doctors at [facility] on 11/01/2022.

## 2022-10-13 NOTE — Progress Notes (Signed)
Radiation Oncology         [phone] ________________________________  Initial Outpatient Consultation  Name: Gary [name] MRN: [id number]  Date: 10/14/2022  DOB: June 14, 1966  WU:[id number], Timoteo Expose, MD  Crista Elliot, MD   REFERRING PHYSICIAN: Crista Elliot, MD  DIAGNOSIS: 57 y.o. gentleman with Stage T1c adenocarcinoma of the prostate with Gleason score of 3+4, and PSA of 4.54.  No diagnosis found.  HISTORY OF PRESENT ILLNESS: Gary [name] is a 57 y.o. male with a diagnosis of prostate cancer. He was noted to have an elevated PSA of 5.13 by his primary care physician, Dr. [place].  Accordingly, he was referred for evaluation in urology by Dr. Alvester Morin on 07/11/22,  digital rectal examination performed at that time showed no nodules or other abnormalities. A repeat PSA obtained that day showed a drop but persistent elevation at 4.54. He underwent prostate MRI on 08/22/22 showing: three PI-RADS 4 lesions-- in apical anterior transitional zone, mid gland anterior transitional zone along anterior fibromuscular stroma, and left posterolateral base. The patient proceeded to MRI fusion biopsy of the prostate on 09/17/22.  The prostate volume measured 37.83 cc.  Out of 21 core biopsies, 7 were positive.  The maximum Gleason score was 3+4, and this was seen in left base. Additionally, Gleason 3+3 was seen in right base (small focus, with perineural invasion), left base lateral, one sample from ROI #3 (small focus), and all three samples from ROI #1.  The patient reviewed the biopsy results with his urologist and he has kindly been referred today for discussion of potential radiation treatment options.   PREVIOUS RADIATION THERAPY: No  PAST MEDICAL HISTORY:  Past Medical History:  Diagnosis Date   Allergy    Anxiety    GERD (gastroesophageal reflux disease)    Hiatal hernia 2013   egd   Hyperlipidemia       PAST SURGICAL HISTORY: Past Surgical History:  Procedure Laterality Date   MOHS  SURGERY  november 2013   melanoma removed from back   rotater cuff repair Right 2020   TONSILLECTOMY     UPPER GASTROINTESTINAL ENDOSCOPY  2013   hh    FAMILY HISTORY:  Family History  Problem Relation Age of Onset   Breast cancer Mother    Uterine cancer Mother    Leukemia Father    Arthritis Other    Hypertension Other    Thyroid disease Other    Uterine cancer Other    Heart disease Other    Crohn's disease Maternal Grandmother    Colon polyps Brother    Colon cancer Other        great maternal grandfather    Colon cancer Other        great uncle    Esophageal cancer Neg Hx    Rectal cancer Neg Hx    Stomach cancer Neg Hx     SOCIAL HISTORY:  Social History   Socioeconomic History   Marital status: Married    Spouse name: Not on file   Number of children: 1   Years of education: Not on file   Highest education level: Not on file  Occupational History   Occupation: Civil engineer, contracting: UNEMPLOYED  Tobacco Use   Smoking status: Former    Packs/day: 0.25    Years: 20.00    Additional pack years: 0.00    Total pack years: 5.00    Types: Cigarettes    Quit date: 2016  Years since quitting: 8.2   Smokeless tobacco: Never  Substance and Sexual Activity   Alcohol use: Not Currently    Comment: Drinks a beer once in awhile.  Not often   Drug use: No   Sexual activity: Not on file  Other Topics Concern   Not on file  Social History Narrative   Not on file   Social Determinants of Health   Financial Resource Strain: Not on file  Food Insecurity: Not on file  Transportation Needs: Not on file  Physical Activity: Not on file  Stress: Not on file  Social Connections: Not on file  Intimate Partner Violence: Not on file    ALLERGIES: Patient has no known allergies.  MEDICATIONS:  Current Outpatient Medications  Medication Sig Dispense Refill   aspirin 81 MG tablet Take 81 mg by mouth daily.     betamethasone dipropionate 0.05 % cream Apply  topically 2 (two) times daily. 30 g 1   citalopram (CELEXA) 10 MG tablet Take 1 tablet (10 mg total) by mouth every other day. 45 tablet 3   pantoprazole (PROTONIX) 20 MG tablet Take 1 tablet (20 mg total) by mouth daily. 90 tablet 2   pravastatin (PRAVACHOL) 20 MG tablet Take 1.5 tablets (30 mg total) by mouth daily. 135 tablet 1   valACYclovir (VALTREX) 1000 MG tablet TAKE 2 TABLETS BY MOUTH AT ONSET OF FEVER BLISTERS AND REPEAT IN 12 HOURS FOR 1 DAY AS NEEDED 20 tablet 1   ZETIA 10 MG tablet      No current facility-administered medications for this encounter.    REVIEW OF SYSTEMS:  On review of systems, the patient reports that he is doing well overall. He denies any chest pain, shortness of breath, cough, fevers, chills, night sweats, unintended weight changes. He denies any bowel disturbances, and denies abdominal pain, nausea or vomiting. He denies any new musculoskeletal or joint aches or pains. His IPSS was ***, indicating *** urinary symptoms. His SHIM was ***, indicating he {does not have/has mild/moderate/severe} erectile dysfunction. A complete review of systems is obtained and is otherwise negative.    PHYSICAL EXAM:  Wt Readings from Last 3 Encounters:  06/14/22 193 lb (87.5 kg)  05/21/21 192 lb 6.4 oz (87.3 kg)  05/22/20 203 lb 12.8 oz (92.4 kg)   Temp Readings from Last 3 Encounters:  06/14/22 97.9 F (36.6 C) (Oral)  05/21/21 97.8 F (36.6 C) (Oral)  11/22/20 98.6 F (37 C) (Oral)   BP Readings from Last 3 Encounters:  06/14/22 118/70  05/21/21 128/80  05/22/20 132/84   Pulse Readings from Last 3 Encounters:  06/14/22 79  05/21/21 65  05/22/20 76    /10  In general this is a well appearing *** male in no acute distress. He's alert and oriented x4 and appropriate throughout the examination. Cardiopulmonary assessment is negative for acute distress, and he exhibits normal effort.     KPS = ***  100 - Normal; no complaints; no evidence of disease. 90   -  Able to carry on normal activity; minor signs or symptoms of disease. 80   - Normal activity with effort; some signs or symptoms of disease. 49   - Cares for self; unable to carry on normal activity or to do active work. 60   - Requires occasional assistance, but is able to care for most of his personal needs. 50   - Requires considerable assistance and frequent medical care. 40   - Disabled; requires special care  and assistance. 30   - Severely disabled; hospital admission is indicated although death not imminent. 20   - Very sick; hospital admission necessary; active supportive treatment necessary. 10   - Moribund; fatal processes progressing rapidly. 0     - Dead  Karnofsky DA, Abelmann WH, Craver LS and Burchenal [facility] [phone](1948) The use of the nitrogen mustards in the palliative treatment of carcinoma: with particular reference to bronchogenic carcinoma Cancer 1 634-56  LABORATORY DATA:  Lab Results  Component Value Date   WBC 5.0 05/21/2021   HGB 15.0 05/21/2021   HCT 44.7 05/21/2021   MCV 88.1 05/21/2021   PLT 224.0 05/21/2021   Lab Results  Component Value Date   NA 137 06/14/2022   K 4.2 06/14/2022   CL 100 06/14/2022   CO2 31 06/14/2022   Lab Results  Component Value Date   ALT 24 06/14/2022   AST 28 06/14/2022   ALKPHOS 61 06/14/2022   BILITOT 0.8 06/14/2022     RADIOGRAPHY: No results found.    IMPRESSION/PLAN: 1. 57 y.o. gentleman with Stage T1c adenocarcinoma of the prostate with Gleason Score of 3+4, and PSA of 4.54. We discussed the patient's workup and outlined the nature of prostate cancer in this setting. The patient's T stage, Gleason's score, and PSA put him into the favorable intermediate risk group. Accordingly, he is eligible for a variety of potential treatment options including brachytherapy, 5.5 weeks of external radiation, or prostatectomy. We discussed the available radiation techniques, and focused on the details and logistics of delivery. We discussed  and outlined the risks, benefits, short and long-term effects associated with radiotherapy and compared and contrasted these with prostatectomy. We discussed the role of SpaceOAR gel in reducing the rectal toxicity associated with radiotherapy. He appears to have a good understanding of his disease and our treatment recommendations which are of curative intent.  He was encouraged to ask questions that were answered to his stated satisfaction.  At the conclusion of our conversation, the patient is interested in moving forward with ***.  We personally spent *** minutes in this encounter including chart review, reviewing radiological studies, meeting face-to-face with the patient, entering orders and completing documentation.     Joyice FasterEllie Muir, PA-C    Margaretmary DysMatthew Cleve Paolillo, MD  [facility]  Radiation Oncology Direct Dial: [phone]  Fax: [phone] [email or web address]  Skype  LinkedIn   This document serves as a record of services personally performed by Margaretmary DysMatthew Earnstine Meinders, MD and Joyice FasterEllie Muir, PA-C. It was created on their behalf by Mickie BailKatie Daubenspeck, a trained medical scribe. The creation of this record is based on the scribe's personal observations and the provider's statements to them. This document has been checked and approved by the attending provider.

## 2022-10-14 ENCOUNTER — Ambulatory Visit
Admission: RE | Admit: 2022-10-14 | Discharge: 2022-10-14 | Disposition: A | Discharge status: Home / Self Care | Payer: No Typology Code available for payment source | Source: Ambulatory Visit | Attending: Radiation Oncology | Admitting: Radiation Oncology

## 2022-10-14 ENCOUNTER — Encounter: Payer: Self-pay | Admitting: Radiation Oncology

## 2022-10-14 VITALS — BP 138/92 | HR 73 | Temp 97.6°F | Resp 18 | Ht 69.0 in | Wt 202.1 lb

## 2022-10-14 DIAGNOSIS — C61 Malignant neoplasm of prostate: Secondary | ICD-10-CM | POA: Diagnosis present

## 2022-10-14 DIAGNOSIS — Z806 Family history of leukemia: Secondary | ICD-10-CM | POA: Diagnosis not present

## 2022-10-14 DIAGNOSIS — Z803 Family history of malignant neoplasm of breast: Secondary | ICD-10-CM | POA: Insufficient documentation

## 2022-10-14 DIAGNOSIS — Z79624 Long term (current) use of inhibitors of nucleotide synthesis: Secondary | ICD-10-CM | POA: Diagnosis not present

## 2022-10-14 DIAGNOSIS — Z7982 Long term (current) use of aspirin: Secondary | ICD-10-CM | POA: Insufficient documentation

## 2022-10-14 DIAGNOSIS — K219 Gastro-esophageal reflux disease without esophagitis: Secondary | ICD-10-CM | POA: Diagnosis not present

## 2022-10-14 DIAGNOSIS — K449 Diaphragmatic hernia without obstruction or gangrene: Secondary | ICD-10-CM | POA: Diagnosis not present

## 2022-10-14 DIAGNOSIS — Z79899 Other long term (current) drug therapy: Secondary | ICD-10-CM | POA: Diagnosis not present

## 2022-10-14 DIAGNOSIS — Z800 Family history of malignant neoplasm of digestive organs: Secondary | ICD-10-CM | POA: Diagnosis not present

## 2022-10-14 DIAGNOSIS — Z87891 Personal history of nicotine dependence: Secondary | ICD-10-CM | POA: Diagnosis not present

## 2022-10-14 DIAGNOSIS — E785 Hyperlipidemia, unspecified: Secondary | ICD-10-CM | POA: Diagnosis not present

## 2022-10-14 NOTE — Progress Notes (Signed)
Introduced myself to the patient, and his wife, as the prostate nurse navigator.  No barriers to care identified at this time.  He is here to discuss his radiation treatment options, and does have a surgical consult on 4/16.  I gave him my business card and asked him to call me with questions or concerns.  Verbalized understanding.

## 2022-10-16 ENCOUNTER — Other Ambulatory Visit (HOSPITAL_COMMUNITY): Payer: Self-pay

## 2022-10-28 NOTE — Progress Notes (Signed)
RN left message for call back to follow up after recent consults to ensure treatment decision and/or questions.

## 2022-11-14 ENCOUNTER — Other Ambulatory Visit (HOSPITAL_COMMUNITY): Payer: Self-pay

## 2022-11-14 NOTE — Progress Notes (Signed)
Patient has decided to proceed with surgery at [facility] for his stage T1c adenocarcinoma of the prostate with Gleason score of 3+4, and PSA of 4.54.

## 2022-11-26 ENCOUNTER — Other Ambulatory Visit (HOSPITAL_COMMUNITY): Payer: Self-pay

## 2022-11-26 MED ORDER — CELECOXIB 100 MG PO CAPS
100.0000 mg | ORAL_CAPSULE | Freq: Two times a day (BID) | ORAL | 0 refills | Status: DC
  Filled 2022-11-26: qty 14, 7d supply, fill #0

## 2022-11-26 MED ORDER — ACETAMINOPHEN 325 MG PO TABS: ORAL_TABLET | ORAL | 0 refills | Status: DC

## 2022-11-26 MED ORDER — OXYCODONE HCL 5 MG PO TABS
5.0000 mg | ORAL_TABLET | Freq: Four times a day (QID) | ORAL | 0 refills | Status: DC | PRN
  Filled 2022-11-26: qty 5, 1d supply, fill #0

## 2022-11-26 MED ORDER — POLYETHYLENE GLYCOL 3350 17 G PO PACK: PACK | ORAL | 0 refills | Status: DC

## 2022-11-26 MED ORDER — OXYBUTYNIN CHLORIDE 5 MG PO TABS
5.0000 mg | ORAL_TABLET | Freq: Three times a day (TID) | ORAL | 0 refills | Status: DC | PRN
  Filled 2022-11-26: qty 42, 14d supply, fill #0

## 2022-11-26 MED ORDER — BACITRACIN 500 UNIT/GM EX OINT
TOPICAL_OINTMENT | CUTANEOUS | 0 refills | Status: DC
  Filled 2023-04-03: qty 30, 10d supply, fill #0
  Filled 2023-04-15: qty 28, 10d supply, fill #0

## 2022-11-26 MED ORDER — SENNOSIDES-DOCUSATE SODIUM 8.6-50 MG PO TABS: ORAL_TABLET | ORAL | 0 refills | Status: DC

## 2022-11-26 MED ORDER — CIPROFLOXACIN HCL 500 MG PO TABS
ORAL_TABLET | ORAL | 0 refills | Status: DC
  Filled 2022-11-26: qty 1, 1d supply, fill #0

## 2022-12-16 ENCOUNTER — Other Ambulatory Visit (HOSPITAL_COMMUNITY): Payer: Self-pay

## 2022-12-24 ENCOUNTER — Other Ambulatory Visit (HOSPITAL_COMMUNITY): Payer: Self-pay

## 2022-12-24 MED ORDER — TADALAFIL 5 MG PO TABS
5.0000 mg | ORAL_TABLET | Freq: Every day | ORAL | 1 refills | Status: DC
  Filled 2022-12-24: qty 30, 30d supply, fill #0
  Filled 2023-01-27: qty 30, 30d supply, fill #1

## 2023-01-01 ENCOUNTER — Other Ambulatory Visit (HOSPITAL_COMMUNITY): Payer: Self-pay

## 2023-01-01 MED ORDER — TRIAMCINOLONE ACETONIDE 0.1 % EX CREA
TOPICAL_CREAM | CUTANEOUS | 1 refills | Status: DC
  Filled 2023-01-01: qty 80, 14d supply, fill #0

## 2023-01-08 ENCOUNTER — Other Ambulatory Visit (HOSPITAL_COMMUNITY): Payer: Self-pay

## 2023-01-08 ENCOUNTER — Other Ambulatory Visit: Payer: Self-pay | Admitting: Family Medicine

## 2023-01-08 DIAGNOSIS — B001 Herpesviral vesicular dermatitis: Secondary | ICD-10-CM

## 2023-01-08 DIAGNOSIS — E785 Hyperlipidemia, unspecified: Secondary | ICD-10-CM

## 2023-01-08 MED ORDER — VALACYCLOVIR HCL 1 G PO TABS
ORAL_TABLET | ORAL | 1 refills | Status: DC
Start: 2023-01-08 — End: 2024-04-28
  Filled 2023-01-08: qty 20, 5d supply, fill #0
  Filled 2023-07-10: qty 20, 5d supply, fill #1

## 2023-01-08 MED ORDER — PRAVASTATIN SODIUM 20 MG PO TABS
30.0000 mg | ORAL_TABLET | Freq: Every day | ORAL | 1 refills | Status: DC
Start: 2023-01-08 — End: 2023-06-17
  Filled 2023-01-08: qty 135, 90d supply, fill #0
  Filled 2023-02-21 – 2023-04-15 (×2): qty 135, 90d supply, fill #1

## 2023-01-20 ENCOUNTER — Other Ambulatory Visit (HOSPITAL_COMMUNITY): Payer: Self-pay

## 2023-01-20 MED ORDER — SILDENAFIL CITRATE 100 MG PO TABS
ORAL_TABLET | ORAL | 11 refills | Status: AC
  Filled 2023-01-20: qty 6, 25d supply, fill #0
  Filled 2023-04-03: qty 6, 25d supply, fill #1
  Filled 2023-05-20: qty 6, 25d supply, fill #2
  Filled 2023-07-10: qty 6, 25d supply, fill #3
  Filled 2023-11-14: qty 6, 25d supply, fill #4
  Filled 2024-01-03: qty 6, 25d supply, fill #5

## 2023-01-28 ENCOUNTER — Other Ambulatory Visit (HOSPITAL_COMMUNITY): Payer: Self-pay

## 2023-02-20 ENCOUNTER — Other Ambulatory Visit (HOSPITAL_COMMUNITY): Payer: Self-pay

## 2023-02-21 ENCOUNTER — Other Ambulatory Visit (HOSPITAL_COMMUNITY): Payer: Self-pay

## 2023-02-26 ENCOUNTER — Other Ambulatory Visit (HOSPITAL_COMMUNITY): Payer: Self-pay

## 2023-04-03 ENCOUNTER — Other Ambulatory Visit (HOSPITAL_COMMUNITY): Payer: Self-pay

## 2023-04-03 ENCOUNTER — Other Ambulatory Visit: Payer: Self-pay

## 2023-04-15 ENCOUNTER — Other Ambulatory Visit: Payer: Self-pay

## 2023-04-15 ENCOUNTER — Other Ambulatory Visit (HOSPITAL_COMMUNITY): Payer: Self-pay

## 2023-05-20 ENCOUNTER — Other Ambulatory Visit: Payer: Self-pay

## 2023-05-23 ENCOUNTER — Other Ambulatory Visit (HOSPITAL_COMMUNITY): Payer: Self-pay

## 2023-05-28 ENCOUNTER — Other Ambulatory Visit (HOSPITAL_COMMUNITY): Payer: Self-pay

## 2023-06-16 ENCOUNTER — Other Ambulatory Visit: Payer: Self-pay

## 2023-06-16 ENCOUNTER — Ambulatory Visit: Payer: No Typology Code available for payment source | Admitting: Family Medicine

## 2023-06-16 ENCOUNTER — Other Ambulatory Visit (HOSPITAL_COMMUNITY): Payer: Self-pay

## 2023-06-16 ENCOUNTER — Encounter: Payer: Self-pay | Admitting: Family Medicine

## 2023-06-16 VITALS — BP 122/80 | HR 75 | Temp 97.5°F | Resp 16 | Ht 69.0 in | Wt 193.2 lb

## 2023-06-16 DIAGNOSIS — K219 Gastro-esophageal reflux disease without esophagitis: Secondary | ICD-10-CM | POA: Diagnosis not present

## 2023-06-16 DIAGNOSIS — F419 Anxiety disorder, unspecified: Secondary | ICD-10-CM

## 2023-06-16 DIAGNOSIS — R7303 Prediabetes: Secondary | ICD-10-CM

## 2023-06-16 DIAGNOSIS — Z0000 Encounter for general adult medical examination without abnormal findings: Secondary | ICD-10-CM

## 2023-06-16 DIAGNOSIS — E785 Hyperlipidemia, unspecified: Secondary | ICD-10-CM | POA: Diagnosis not present

## 2023-06-16 LAB — LIPID PANEL
Cholesterol: 180 mg/dL (ref 0–200)
HDL: 45.6 mg/dL (ref >39.00)
LDL Cholesterol: 116 mg/dL — ABNORMAL HIGH (ref 0–99)
NonHDL: 134.7
Total CHOL/HDL Ratio: 4
Triglycerides: 94 mg/dL (ref 0.0–149.0)
VLDL: 18.8 mg/dL (ref 0.0–40.0)

## 2023-06-16 LAB — COMPREHENSIVE METABOLIC PANEL
ALT: 22 U/L (ref 0–53)
AST: 25 U/L (ref 0–37)
Albumin: 4.5 g/dL (ref 3.5–5.2)
Alkaline Phosphatase: 67 U/L (ref 39–117)
BUN: 12 mg/dL (ref 6–23)
CO2: 27 meq/L (ref 19–32)
Calcium: 9 mg/dL (ref 8.4–10.5)
Chloride: 102 meq/L (ref 96–112)
Creatinine, Ser: 1.1 mg/dL (ref 0.40–1.50)
GFR: 73.63 mL/min (ref >60.00)
Glucose, Bld: 89 mg/dL (ref 70–99)
Potassium: 4.5 meq/L (ref 3.5–5.1)
Sodium: 137 meq/L (ref 135–145)
Total Bilirubin: 0.8 mg/dL (ref 0.2–1.2)
Total Protein: 6.5 g/dL (ref 6.0–8.3)

## 2023-06-16 LAB — HEMOGLOBIN A1C: Hgb A1c MFr Bld: 6.2 % (ref 4.6–6.5)

## 2023-06-16 MED ORDER — CITALOPRAM HYDROBROMIDE 10 MG PO TABS
10.0000 mg | ORAL_TABLET | ORAL | 0 refills | Status: DC
  Filled 2023-06-16: qty 45, 90d supply, fill #0

## 2023-06-16 MED ORDER — PANTOPRAZOLE SODIUM 20 MG PO TBEC
20.0000 mg | DELAYED_RELEASE_TABLET | Freq: Every day | ORAL | 2 refills | Status: DC
  Filled 2023-06-16: qty 90, 90d supply, fill #0
  Filled 2023-11-14: qty 90, 90d supply, fill #1
  Filled 2024-03-29: qty 90, 90d supply, fill #2

## 2023-06-16 NOTE — Patient Instructions (Addendum)
A few things to remember from today's visit:  Routine general medical examination at a health care facility  Hyperlipidemia, mild - Plan: Comprehensive metabolic panel, Lipid panel  Anxiety disorder, unspecified type - Plan: citalopram (CELEXA) 10 MG tablet  Gastroesophageal reflux disease without esophagitis - Plan: pantoprazole (PROTONIX) 20 MG tablet  Prediabetes - Plan: Hemoglobin A1c  Celexa 1/2 tab every other day for 2 weeks, then every 3rd day for 2 weeks and stop.  If you need refills for medications you take chronically, please call your pharmacy. Do not use My Chart to request refills or for acute issues that need immediate attention. If you send a my chart message, it may take a few days to be addressed, specially if I am not in the office.  Please be sure medication list is accurate. If a new problem present, please set up appointment sooner than planned today.  Health Maintenance, Male Adopting a healthy lifestyle and getting preventive care are important in promoting health and wellness. Ask your health care provider about: The right schedule for you to have regular tests and exams. Things you can do on your own to prevent diseases and keep yourself healthy. What should I know about diet, weight, and exercise? Eat a healthy diet  Eat a diet that includes plenty of vegetables, fruits, low-fat dairy products, and lean protein. Do not eat a lot of foods that are high in solid fats, added sugars, or sodium. Maintain a healthy weight Body mass index (BMI) is a measurement that can be used to identify possible weight problems. It estimates body fat based on height and weight. Your health care provider can help determine your BMI and help you achieve or maintain a healthy weight. Get regular exercise Get regular exercise. This is one of the most important things you can do for your health. Most adults should: Exercise for at least 150 minutes each week. The exercise should  increase your heart rate and make you sweat (moderate-intensity exercise). Do strengthening exercises at least twice a week. This is in addition to the moderate-intensity exercise. Spend less time sitting. Even light physical activity can be beneficial. Watch cholesterol and blood lipids Have your blood tested for lipids and cholesterol at 59 years of age, then have this test every 5 years. You may need to have your cholesterol levels checked more often if: Your lipid or cholesterol levels are high. You are older than 59 years of age. You are at high risk for heart disease. What should I know about cancer screening? Many types of cancers can be detected early and may often be prevented. Depending on your health history and family history, you may need to have cancer screening at various ages. This may include screening for: Colorectal cancer. Prostate cancer. Skin cancer. Lung cancer. What should I know about heart disease, diabetes, and high blood pressure? Blood pressure and heart disease High blood pressure causes heart disease and increases the risk of stroke. This is more likely to develop in people who have high blood pressure readings or are overweight. Talk with your health care provider about your target blood pressure readings. Have your blood pressure checked: Every 3-5 years if you are 65-59 years of age. Every year if you are 49 years old or older. If you are between the ages of 71 and 3 and are a current or former smoker, ask your health care provider if you should have a one-time screening for abdominal aortic aneurysm (AAA). Diabetes Have regular diabetes  screenings. This checks your fasting blood sugar level. Have the screening done: Once every three years after age 69 if you are at a normal weight and have a low risk for diabetes. More often and at a younger age if you are overweight or have a high risk for diabetes. What should I know about preventing  infection? Hepatitis B If you have a higher risk for hepatitis B, you should be screened for this virus. Talk with your health care provider to find out if you are at risk for hepatitis B infection. Hepatitis C Blood testing is recommended for: Everyone born from [birth year] through 1967. Anyone with known risk factors for hepatitis C. Sexually transmitted infections (STIs) You should be screened each year for STIs, including gonorrhea and chlamydia, if: You are sexually active and are younger than 59 years of age. You are older than 59 years of age and your health care provider tells you that you are at risk for this type of infection. Your sexual activity has changed since you were last screened, and you are at increased risk for chlamydia or gonorrhea. Ask your health care provider if you are at risk. Ask your health care provider about whether you are at high risk for HIV. Your health care provider may recommend a prescription medicine to help prevent HIV infection. If you choose to take medicine to prevent HIV, you should first get tested for HIV. You should then be tested every 3 months for as long as you are taking the medicine. Follow these instructions at home: Alcohol use Do not drink alcohol if your health care provider tells you not to drink. If you drink alcohol: Limit how much you have to 0-2 drinks a day. Know how much alcohol is in your drink. In the [place] one drink equals one 12 oz bottle of beer (355 mL), one 5 oz glass of wine (148 mL), or one 1 oz glass of hard liquor (44 mL). Lifestyle Do not use any products that contain nicotine or tobacco. These products include cigarettes, chewing tobacco, and vaping devices, such as e-cigarettes. If you need help quitting, ask your health care provider. Do not use street drugs. Do not share needles. Ask your health care provider for help if you need support or information about quitting drugs. General instructions Schedule regular health,  dental, and eye exams. Stay current with your vaccines. Tell your health care provider if: You often feel depressed. You have ever been abused or do not feel safe at home. Summary Adopting a healthy lifestyle and getting preventive care are important in promoting health and wellness. Follow your health care provider's instructions about healthy diet, exercising, and getting tested or screened for diseases. Follow your health care provider's instructions on monitoring your cholesterol and blood pressure. This information is not intended to replace advice given to you by your health care provider. Make sure you discuss any questions you have with your health care provider. Document Revised: 11/13/2020 Document Reviewed: 11/13/2020 Elsevier Patient Education  2024 [employer].

## 2023-06-16 NOTE — Progress Notes (Signed)
HPI: [name] is a 57 y.o.male with a PMHx significant for HLD, prediabetes, anxiety, GERD, and prostate cancer, who is here today for his routine physical examination.  Last CPE: 06/14/2022  Exercise: Patient states he has been walking about 4 miles (~16000 steps) per day Diet: He is eating healthy in general. He tries to avoid carbs and sweets. He says he eats vegetables daily.  Sleep: 7-8 hours per night.  Alcohol Use: none Smoking: He used to smoke but not heavily. He quit in 2016.  Vision: UTD on routine vision care.  Dental: UTD on routine dental care.   Immunization History  Administered Date(s) Administered   Influenza Split 03/28/2014   Influenza Whole 04/07/2010   Influenza-Unspecified 04/11/2015, 02/14/2016   PFIZER Comirnaty(Gray Top)Covid-19 Tri-Sucrose Vaccine 05/05/2020   PFIZER(Purple Top)SARS-COV-2 Vaccination 07/08/2019, 08/08/2019, 06/06/2020   Td 07/08/1998, 03/01/2009   Tdap 05/22/2020   Zoster Recombinant(Shingrix) 05/21/2021, 07/16/2021    Health Maintenance  Topic Date Due   INFLUENZA VACCINE  02/06/2023   COVID-19 Vaccine (5 - 2023-24 season) 03/09/2023   HIV Screening  05/21/2029 (Originally 03/27/1981)   Colonoscopy  06/22/2024   DTaP/Tdap/Td (4 - Td or Tdap) 05/22/2030   Hepatitis C Screening  Completed   Zoster Vaccines- Shingrix  Completed   HPV VACCINES  Aged Out   Chronic medical problems:   Prostate cancer:  He had a prostatectomy on 5/20 with Dr. Lupe Carney. He last followed with her on 9/11.  Has recovered well.  Hyperlipidemia: Currently on pravastatin 20 mg 1.5 tab daily.  Side effects from medication: none Lab Results  Component Value Date   CHOL 217 (H) 06/14/2022   HDL 55.50 06/14/2022   LDLCALC 135 (H) 06/14/2022   LDLDIRECT 118.0 09/26/2016   TRIG 132.0 06/14/2022   CHOLHDL 4 06/14/2022   Prediabetes: Lab Results  Component Value Date   HGBA1C 6.2 06/14/2022   Anxiety:  Currently on citalopram 10 mg  every other day.  He has not experience anxiety in a while,  GERD:  Currently on pantoprazole 20 mg daily. He doesn't have any problems when on the medication.   Review of Systems  Constitutional:  Negative for activity change, appetite change and fever.  HENT:  Negative for nosebleeds, sore throat and trouble swallowing.   Eyes:  Negative for redness and visual disturbance.  Respiratory:  Negative for cough, shortness of breath and wheezing.   Cardiovascular:  Negative for chest pain, palpitations and leg swelling.  Gastrointestinal:  Negative for abdominal pain, blood in stool, nausea and vomiting.  Endocrine: Negative for cold intolerance, heat intolerance, polydipsia, polyphagia and polyuria.  Genitourinary:  Negative for decreased urine volume, dysuria and hematuria.  Musculoskeletal:  Negative for gait problem and myalgias.  Skin:  Negative for color change and rash.  Allergic/Immunologic: Positive for environmental allergies.  Neurological:  Negative for syncope, weakness and headaches.  Hematological:  Negative for adenopathy. Does not bruise/bleed easily.  Psychiatric/Behavioral:  Negative for confusion. The patient is not nervous/anxious.   All other systems reviewed and are negative.  Current Outpatient Medications on File Prior to Visit  Medication Sig Dispense Refill   acetaminophen (TYLENOL) 325 MG tablet Take 3 tablets by mouth every 8 hours for 7 days 63 tablet 0   aspirin 81 MG tablet Take 81 mg by mouth daily.     bacitracin 500 UNIT/GM ointment Apply topically 3 times daily for 10 days 28 g 0   celecoxib (CELEBREX) 100 MG capsule Take 1  capsule by mouth every 12 hours for 7 days 14 capsule 0   ciprofloxacin (CIPRO) 500 MG tablet Take 1 tablet by mouth as directed for 1 dose (take on morning of follow-up appointment before catheter removal) 1 tablet 0   citalopram (CELEXA) 10 MG tablet Take 1 tablet (10 mg total) by mouth every other day. 45 tablet 3   oxybutynin  (DITROPAN) 5 MG tablet Take 1 tablet by mouth every 8 hours as needed for up to 14 days 42 tablet 0   oxyCODONE (OXY IR/ROXICODONE) 5 MG immediate release tablet Take 1-2 tablets (5-10 mg total) by mouth every 6 (six) hours as needed for up to 2 days 5 tablet 0   pantoprazole (PROTONIX) 20 MG tablet Take 1 tablet (20 mg total) by mouth daily. 90 tablet 2   polyethylene glycol (MIRALAX / GLYCOLAX) 17 g packet Take 1 packet (17 g total) by mouth once daily for 7 days Mix in 4-8ounces of fluid prior to taking. 7 packet 0   pravastatin (PRAVACHOL) 20 MG tablet Take 1 and 1/2 tablet (30 mg total) by mouth daily. 135 tablet 1   senna-docusate (SENOKOT-S) 8.6-50 MG tablet Take 2 tablets by mouth 2 (two) times daily for 7 days 28 tablet 0   sildenafil (VIAGRA) 100 MG tablet Take 1 tablet (100 mg) by mouth once daily as needed for Erectile Dysfunction 15 tablet 11   tadalafil (CIALIS) 5 MG tablet Take 1 tablet (5 mg total) by mouth daily. 30 tablet 1   triamcinolone cream (KENALOG) 0.1 % Apply 1 application liberally to the affected area(s) twice a day for 1-2 weeks 80 g 1   valACYclovir (VALTREX) 1000 MG tablet TAKE 2 TABLETS BY MOUTH AT ONSET OF FEVER BLISTERS AND REPEAT IN 12 HOURS FOR 1 DAY AS NEEDED 20 tablet 1   No current facility-administered medications on file prior to visit.    Past Medical History:  Diagnosis Date   Allergy    Anxiety    GERD (gastroesophageal reflux disease)    Hiatal hernia 2013   egd   Hyperlipidemia     Past Surgical History:  Procedure Laterality Date   MOHS SURGERY  november 2013   melanoma removed from back   rotater cuff repair Right 2020   TONSILLECTOMY     UPPER GASTROINTESTINAL ENDOSCOPY  2013   [facility]    No Known Allergies  Family History  Problem Relation Age of Onset   Breast cancer Mother    Uterine cancer Mother    Leukemia Father    Arthritis Other    Hypertension Other    Thyroid disease Other    Uterine cancer Other    Heart disease  Other    Crohn's disease Maternal Grandmother    Colon polyps Brother    Colon cancer Other        great maternal grandfather    Colon cancer Other        great uncle    Esophageal cancer Neg Hx    Rectal cancer Neg Hx    Stomach cancer Neg Hx     Social History   Socioeconomic History   Marital status: Married    Spouse name: Not on file   Number of children: 1   Years of education: Not on file   Highest education level: Not on file  Occupational History   Occupation: Civil engineer, contracting: UNEMPLOYED  Tobacco Use   Smoking status: Former    Current  packs/day: 0.00    Average packs/day: 0.3 packs/day for 20.0 years (5.0 ttl pk-yrs)    Types: Cigarettes    Start date: 25    Quit date: 2016    Years since quitting: 8.9   Smokeless tobacco: Never  Substance and Sexual Activity   Alcohol use: Not Currently    Comment: Drinks a beer once in awhile.  Not often   Drug use: No   Sexual activity: Not on file  Other Topics Concern   Not on file  Social History Narrative   Not on file   Social Determinants of Health   Financial Resource Strain: Not on file  Food Insecurity: No Food Insecurity (10/14/2022)   Hunger Vital Sign    Worried About Running Out of Food in the Last Year: Never true    Ran Out of Food in the Last Year: Never true  Transportation Needs: No Transportation Needs (10/14/2022)   PRAPARE - Administrator, Civil Service (Medical): No    Lack of Transportation (Non-Medical): No  Physical Activity: Not on file  Stress: Not on file  Social Connections: Not on file   Today's Vitals   06/16/23 1126  BP: 122/80  Pulse: 75  Resp: 16  Temp: (!) 97.5 F (36.4 C)  TempSrc: Oral  SpO2: 97%  Weight: 193 lb 4 oz (87.7 kg)  Height: 5\' 9"  (1.753 m)   Body mass index is 28.54 kg/m.  Wt Readings from Last 3 Encounters:  06/16/23 193 lb 4 oz (87.7 kg)  10/14/22 202 lb 2 oz (91.7 kg)  06/14/22 193 lb (87.5 kg)   Physical Exam Vitals and  nursing note reviewed.  Constitutional:      General: He is not in acute distress.    Appearance: He is well-developed.  HENT:     Head: Normocephalic and atraumatic.     Right Ear: Tympanic membrane, ear canal and external ear normal.     Left Ear: Tympanic membrane, ear canal and external ear normal.     Mouth/Throat:     Mouth: Mucous membranes are moist.     Pharynx: Oropharynx is clear. Uvula midline.  Eyes:     Extraocular Movements: Extraocular movements intact.     Conjunctiva/sclera: Conjunctivae normal.     Pupils: Pupils are equal, round, and reactive to light.  Neck:     Thyroid: No thyroid mass or thyromegaly.     Trachea: No tracheal deviation.  Cardiovascular:     Rate and Rhythm: Normal rate and regular rhythm.     Pulses:          Dorsalis pedis pulses are 2+ on the right side and 2+ on the left side.     Heart sounds: No murmur heard. Pulmonary:     Effort: Pulmonary effort is normal. No respiratory distress.     Breath sounds: Normal breath sounds.  Abdominal:     Palpations: Abdomen is soft. There is no hepatomegaly or mass.     Tenderness: There is no abdominal tenderness.  Genitourinary:    Comments: No concerns. Musculoskeletal:        General: No tenderness.     Cervical back: Normal range of motion.     Right lower leg: No edema.     Left lower leg: No edema.     Comments: No major deformities appreciated and no signs of synovitis.  Lymphadenopathy:     Cervical: No cervical adenopathy.     Upper Body:  Right upper body: No supraclavicular adenopathy.     Left upper body: No supraclavicular adenopathy.  Skin:    General: Skin is warm.     Findings: No erythema.  Neurological:     General: No focal deficit present.     Mental Status: He is alert and oriented to person, place, and time.     Cranial Nerves: No cranial nerve deficit.     Sensory: No sensory deficit.     Motor: No weakness.     Gait: Gait normal.     Deep Tendon Reflexes:      Reflex Scores:      Bicep reflexes are 2+ on the right side and 2+ on the left side.      Patellar reflexes are 2+ on the right side and 2+ on the left side. Psychiatric:        Mood and Affect: Mood and affect normal.    ASSESSMENT AND PLAN:  Mr. Mcquerry was seen today for his routine annual physical examination.  Lab Results  Component Value Date   NA 137 06/16/2023   CL 102 06/16/2023   K 4.5 06/16/2023   CO2 27 06/16/2023   BUN 12 06/16/2023   CREATININE 1.10 06/16/2023   GFR 73.63 06/16/2023   CALCIUM 9.0 06/16/2023   ALBUMIN 4.5 06/16/2023   GLUCOSE 89 06/16/2023   Lab Results  Component Value Date   ALT 22 06/16/2023   AST 25 06/16/2023   ALKPHOS 67 06/16/2023   BILITOT 0.8 06/16/2023   Lab Results  Component Value Date   CHOL 180 06/16/2023   HDL 45.60 06/16/2023   LDLCALC 116 (H) 06/16/2023   LDLDIRECT 118.0 09/26/2016   TRIG 94.0 06/16/2023   CHOLHDL 4 06/16/2023   Lab Results  Component Value Date   HGBA1C 6.2 06/16/2023   Routine general medical examination at a health care facility Assessment & Plan: Continue regular physical activity and a healthful diet for prevention of chronic illness and/or complications. Preventive guidelines reviewed. Vaccination up to date. Next CPE in a year.   Hyperlipidemia, mild Assessment & Plan: Last LDL 135 in 06/2022. Continue Pravastatin 20 mg daily and low fat diet. Further recommendations according to FLP results. The 10-year ASCVD risk score (Arnett DK, et al., 2019) is: 7.1%   Values used to calculate the score:     Age: 61 years     Sex: Male     Is Non-Hispanic African American: No     Diabetic: No     Tobacco smoker: No     Systolic Blood Pressure: 122 mmHg     Is BP treated: No     HDL Cholesterol: 45.6 mg/dL     Total Cholesterol: 180 mg/dL  Orders: -     Comprehensive metabolic panel; Future -     Lipid panel; Future  Anxiety disorder, unspecified type Assessment & Plan: Problem has been  well controlled. He is taking Celexa 10 mg every other day for a while now. He is interested in continue weaning medication off. Instructions given on AVS.  Orders: -     Citalopram Hydrobromide; Take 1 tablet (10 mg total) by mouth every other day.  Dispense: 45 tablet; Refill: 0  Gastroesophageal reflux disease without esophagitis Assessment & Plan: Problem is well controlled. Continue Protonix 20 mg daily and GERD precautions. F/U in a year, before if needed.  Orders: -     Pantoprazole Sodium; Take 1 tablet (20 mg total) by mouth daily.  Dispense: 90 tablet; Refill: 2  Prediabetes Assessment & Plan: Hemoglobin A1c was 6.2 in 06/2022. Continue a healthy lifestyle for diabetes prevention. Further recommendation will be given according to hemoglobin A1c result.  Orders: -     Hemoglobin A1c; Future   Return in 1 year (on 06/15/2024) for CPE, Labs.   I, Suanne Marker, acting as a scribe for Shani Fitch [place], MD., have documented all relevant documentation on the behalf of Chanti Golubski [place], MD, as directed by  Elverna Caffee [place], MD while in the presence of Talullah Abate [place], MD.   I, Suanne Marker, have reviewed all documentation for this visit. The documentation on 06/16/23 for the exam, diagnosis, procedures, and orders are all accurate and complete.  Shawndell Schillaci G. [place], MD  [facility]. [place] office.

## 2023-06-17 MED ORDER — PRAVASTATIN SODIUM 20 MG PO TABS
30.0000 mg | ORAL_TABLET | Freq: Every day | ORAL | 2 refills | Status: DC
  Filled 2023-06-17 – 2023-07-10 (×2): qty 135, 90d supply, fill #0
  Filled 2023-10-01: qty 135, 90d supply, fill #1
  Filled 2024-01-03: qty 135, 90d supply, fill #2

## 2023-06-17 NOTE — Assessment & Plan Note (Signed)
Problem is well controlled. Continue Protonix 20 mg daily and GERD precautions. F/U in a year, before if needed.

## 2023-06-17 NOTE — Assessment & Plan Note (Signed)
Last LDL 135 in 06/2022. Continue Pravastatin 20 mg daily and low fat diet. Further recommendations according to FLP results. The 10-year ASCVD risk score (Arnett DK, et al., 2019) is: 7.1%   Values used to calculate the score:     Age: 57 years     Sex: Male     Is Non-Hispanic African American: No     Diabetic: No     Tobacco smoker: No     Systolic Blood Pressure: 122 mmHg     Is BP treated: No     HDL Cholesterol: 45.6 mg/dL     Total Cholesterol: 180 mg/dL

## 2023-06-17 NOTE — Assessment & Plan Note (Signed)
Problem has been well controlled. He is taking Celexa 10 mg every other day for a while now. He is interested in continue weaning medication off. Instructions given on AVS.

## 2023-06-17 NOTE — Assessment & Plan Note (Signed)
Continue regular physical activity and a healthful diet for prevention of chronic illness and/or complications. Preventive guidelines reviewed. Vaccination up to date. Next CPE in a year.

## 2023-06-17 NOTE — Assessment & Plan Note (Signed)
Hemoglobin A1c was 6.2 in 06/2022. Continue a healthy lifestyle for diabetes prevention. Further recommendation will be given according to hemoglobin A1c result.

## 2023-06-18 ENCOUNTER — Other Ambulatory Visit (HOSPITAL_COMMUNITY): Payer: Self-pay

## 2023-06-24 ENCOUNTER — Other Ambulatory Visit (HOSPITAL_COMMUNITY): Payer: Self-pay

## 2023-07-10 ENCOUNTER — Other Ambulatory Visit (HOSPITAL_COMMUNITY): Payer: Self-pay

## 2023-07-11 ENCOUNTER — Other Ambulatory Visit (HOSPITAL_COMMUNITY): Payer: Self-pay

## 2023-11-14 ENCOUNTER — Other Ambulatory Visit (HOSPITAL_COMMUNITY): Payer: Self-pay

## 2024-03-29 ENCOUNTER — Other Ambulatory Visit (HOSPITAL_COMMUNITY): Payer: Self-pay

## 2024-03-29 ENCOUNTER — Other Ambulatory Visit: Payer: Self-pay | Admitting: Family Medicine

## 2024-03-29 DIAGNOSIS — E785 Hyperlipidemia, unspecified: Secondary | ICD-10-CM

## 2024-03-29 MED ORDER — PRAVASTATIN SODIUM 20 MG PO TABS
30.0000 mg | ORAL_TABLET | Freq: Every day | ORAL | 2 refills | Status: AC
  Filled 2024-03-29: qty 135, 90d supply, fill #0
  Filled 2024-06-28 – 2024-06-30 (×2): qty 135, 90d supply, fill #1

## 2024-03-30 ENCOUNTER — Other Ambulatory Visit (HOSPITAL_COMMUNITY): Payer: Self-pay

## 2024-03-30 ENCOUNTER — Other Ambulatory Visit: Payer: Self-pay

## 2024-03-30 ENCOUNTER — Encounter: Payer: Self-pay | Admitting: Family Medicine

## 2024-03-30 DIAGNOSIS — E785 Hyperlipidemia, unspecified: Secondary | ICD-10-CM

## 2024-04-01 ENCOUNTER — Other Ambulatory Visit (HOSPITAL_COMMUNITY): Payer: Self-pay

## 2024-04-06 ENCOUNTER — Other Ambulatory Visit (HOSPITAL_COMMUNITY): Payer: Self-pay

## 2024-04-08 ENCOUNTER — Other Ambulatory Visit (HOSPITAL_COMMUNITY): Payer: Self-pay

## 2024-04-28 ENCOUNTER — Ambulatory Visit: Payer: Self-pay

## 2024-04-28 ENCOUNTER — Ambulatory Visit: Admitting: Family Medicine

## 2024-04-28 ENCOUNTER — Encounter: Payer: Self-pay | Admitting: Family Medicine

## 2024-04-28 VITALS — BP 138/80 | HR 84 | Temp 97.9°F | Resp 16 | Ht 69.0 in | Wt 195.2 lb

## 2024-04-28 DIAGNOSIS — R030 Elevated blood-pressure reading, without diagnosis of hypertension: Secondary | ICD-10-CM

## 2024-04-28 DIAGNOSIS — M546 Pain in thoracic spine: Secondary | ICD-10-CM | POA: Diagnosis not present

## 2024-04-28 NOTE — Progress Notes (Signed)
 t  ACUTE VISIT Chief Complaint  Patient presents with   Acute Visit    Pain under right arm and back    Discussed the use of AI scribe software for clinical note transcription with the patient, who gave verbal consent to proceed.  History of Present Illness Gary [name] is a 58 year old male with PMHx significant for GERD, hiatal hernia, HLD, and anxiety who presents with right-sided back pain.  He has been experiencing  new onset of right-sided back pain, specifically under and lateral to the interscapular area, which began on Monday night, 2 days ago. The pain is described as a dull ache, rated 3 out of 10, that is intermittent. It worsens when lying down at night, affecting his sleep, although he attributes the lack of sleep to stress rather than pain.  RUE movement can also exacerbate pain. No associated fever, chills, CP, cough, SOB, palpitations, numbness, tingling, or rash. Pain is not related with food intake. No hx of trauma or unusual physical activity.  He maintains a high level of physical activity, walking 20,000 steps a day, including a four-mile walk on the morning of the visit, without discomfort.  He has not taken any medication for the pain.   Anxiety: He previously used citalopram  but discontinued it abruptly in February/2025 after running out while in [place] and did not resume it upon returning.  BP elevated today, no prior hx of HTN. He does not monitor his blood pressure at home.  Review of Systems  Constitutional:  Negative for activity change and appetite change.  HENT:  Negative for sore throat and trouble swallowing.   Eyes:  Negative for visual disturbance.  Cardiovascular:  Negative for leg swelling.  Gastrointestinal:  Negative for abdominal pain, nausea and vomiting.  Genitourinary:  Negative for decreased urine volume and hematuria.  Neurological:  Negative for syncope, weakness and headaches.  See other pertinent positives and negatives in  HPI.  Current Outpatient Medications on File Prior to Visit  Medication Sig Dispense Refill   aspirin 81 MG tablet Take 81 mg by mouth daily.     pantoprazole  (PROTONIX ) 20 MG tablet Take 1 tablet (20 mg total) by mouth daily. 90 tablet 2   pravastatin  (PRAVACHOL ) 20 MG tablet Take 1 and 1/2 tablet (30 mg total) by mouth daily. 135 tablet 2   sildenafil  (VIAGRA ) 100 MG tablet Take 1 tablet (100 mg) by mouth once daily as needed for Erectile Dysfunction 15 tablet 11   No current facility-administered medications on file prior to visit.    Past Medical History:  Diagnosis Date   Allergy    Anxiety    GERD (gastroesophageal reflux disease)    Hiatal hernia 2013   egd   Hyperlipidemia    No Known Allergies  Social History   Socioeconomic History   Marital status: Married    Spouse name: Not on file   Number of children: 1   Years of education: Not on file   Highest education level: Not on file  Occupational History   Occupation: Civil engineer, contracting: UNEMPLOYED  Tobacco Use   Smoking status: Former    Current packs/day: 0.00    Average packs/day: 0.3 packs/day for 20.0 years (5.0 ttl pk-yrs)    Types: Cigarettes    Start date: 38    Quit date: 2016    Years since quitting: 9.8   Smokeless tobacco: Never  Substance and Sexual Activity   Alcohol use: Not Currently  Comment: Drinks a beer once in awhile.  Not often   Drug use: No   Sexual activity: Not on file  Other Topics Concern   Not on file  Social History Narrative   Not on file   Social Drivers of Health   Financial Resource Strain: Not on file  Food Insecurity: No Food Insecurity (10/14/2022)   Hunger Vital Sign    Worried About Running Out of Food in the Last Year: Never true    Ran Out of Food in the Last Year: Never true  Transportation Needs: No Transportation Needs (10/14/2022)   PRAPARE - Administrator, Civil Service (Medical): No    Lack of Transportation (Non-Medical): No   Physical Activity: Not on file  Stress: Not on file  Social Connections: Not on file    Vitals:   04/28/24 1530 04/28/24 1610  BP: (!) 160/100 138/80  Pulse: 84   Resp: 16   Temp: 97.9 F (36.6 C)   SpO2: 97%    Body mass index is 28.83 kg/m.  Physical Exam Vitals and nursing note reviewed.  Constitutional:      General: He is not in acute distress.    Appearance: He is well-developed.  HENT:     Head: Normocephalic and atraumatic.  Eyes:     Conjunctiva/sclera: Conjunctivae normal.  Cardiovascular:     Rate and Rhythm: Normal rate and regular rhythm.     Heart sounds: No murmur heard. Pulmonary:     Effort: Pulmonary effort is normal. No respiratory distress.     Breath sounds: Normal breath sounds.  Abdominal:     Palpations: Abdomen is soft. There is no mass.     Tenderness: There is no abdominal tenderness.  Musculoskeletal:       Back:  Lymphadenopathy:     Cervical: No cervical adenopathy.  Skin:    General: Skin is warm.     Findings: No erythema or rash.  Neurological:     General: No focal deficit present.     Mental Status: He is alert and oriented to person, place, and time.     Gait: Gait normal.  Psychiatric:        Mood and Affect: Mood and affect normal.   ASSESSMENT AND PLAN:  Mr. Tatar was seen today for right-sided thoracic pain.  Acute right-sided thoracic back pain We discussed possible etiologies. Hx and examination do not suggest a serious process, ? Musculoskeletal. Pain as not elicited during examination today. I do not think imaging or blood work are needed at this time. Recommend trying topical icy hot, asper cream,or icy hot patches. Monitor for new symptoms, including rash. Instructed about warning signs.  Elevated blood pressure reading No hx of HTN. Improved after a few minutes. Recommend monitoring BP at home.  Return if symptoms worsen or fail to improve, for keep next appointment.  Loyed Wilmes G. [place], MD  [facility]. [place] office.

## 2024-04-28 NOTE — Patient Instructions (Addendum)
 A few things to remember from today's visit:  Elevated blood pressure reading  Acute right-sided thoracic back pain  It seems musculoskeletal. You can use icy hot patches or asper cream. Monitor for new symptoms including rash.  Monitor blood pressure at home.  If you need refills for medications you take chronically, please call your pharmacy. Do not use My Chart to request refills or for acute issues that need immediate attention. If you send a my chart message, it may take a few days to be addressed, specially if I am not in the office.  Please be sure medication list is accurate. If a new problem present, please set up appointment sooner than planned today.

## 2024-04-28 NOTE — Telephone Encounter (Signed)
 FYI Only or Action Required?: FYI only for provider.  Patient was last seen in primary care on 06/16/2023 by [place], Betty G, MD.  Called Nurse Triage reporting Generalized Body Aches.  Symptoms began 2 days ago.  Symptoms are: gradually worsening.  Triage Disposition: See PCP When Office is Open (Within 3 Days)  Patient/caregiver understands and will follow disposition?: Yes      Copied from CRM [id number]. Topic: Clinical - Red Word Triage >> Apr 28, 2024  2:49 PM Franky GRADE wrote: Red Word that prompted transfer to Nurse Triage: Patient is experiencing pain on the right side of his body since Monday, patient believes the pain would go away but the pain has increased throughout the week.       Reason for Disposition  [1] MILD or MODERATE muscle aches or pain AND [2] taking a statin medicine (a lipid or cholesterol lowering drug)  Answer Assessment - Initial Assessment Questions 1. ONSET: When did the muscle aches or body pains start?      2 days ago 2. LOCATION: What part of your body is hurting? (e.g., entire body, arms, legs)      Under right arm and around back  3. SEVERITY: How bad is the pain? (Scale 1-10; or mild, moderate, severe)     4/10 4. CAUSE: What do you think is causing the pains?     Unsure  5. FEVER: Do you have a fever? If Yes, ask: What is your temperature, how was it measured, and  when did it start?      No  6. OTHER SYMPTOMS: Do you have any other symptoms? (e.g., chest pain, cold or flu symptoms, rash, weakness, weight loss)     No  Protocols used: Muscle Aches and Body Pain-A-AH

## 2024-05-03 ENCOUNTER — Other Ambulatory Visit (HOSPITAL_COMMUNITY): Payer: Self-pay

## 2024-05-03 MED ORDER — SILDENAFIL CITRATE 100 MG PO TABS
100.0000 mg | ORAL_TABLET | Freq: Every day | ORAL | 11 refills | Status: AC | PRN
  Filled 2024-05-03: qty 6, 6d supply, fill #0
  Filled 2024-05-25: qty 15, 15d supply, fill #0

## 2024-05-14 ENCOUNTER — Other Ambulatory Visit (HOSPITAL_COMMUNITY): Payer: Self-pay

## 2024-05-25 ENCOUNTER — Other Ambulatory Visit (HOSPITAL_COMMUNITY): Payer: Self-pay

## 2024-05-25 ENCOUNTER — Other Ambulatory Visit: Payer: Self-pay

## 2024-05-29 ENCOUNTER — Other Ambulatory Visit (HOSPITAL_COMMUNITY): Payer: Self-pay

## 2024-06-16 ENCOUNTER — Encounter: Payer: Self-pay | Admitting: Family Medicine

## 2024-06-16 ENCOUNTER — Ambulatory Visit: Payer: Self-pay | Admitting: Family Medicine

## 2024-06-16 ENCOUNTER — Other Ambulatory Visit (HOSPITAL_COMMUNITY): Payer: Self-pay

## 2024-06-16 ENCOUNTER — Ambulatory Visit (INDEPENDENT_AMBULATORY_CARE_PROVIDER_SITE_OTHER): Admitting: Family Medicine

## 2024-06-16 VITALS — BP 128/80 | HR 66 | Temp 97.9°F | Resp 16 | Ht 69.0 in | Wt 196.6 lb

## 2024-06-16 DIAGNOSIS — Z0000 Encounter for general adult medical examination without abnormal findings: Secondary | ICD-10-CM

## 2024-06-16 DIAGNOSIS — Z1329 Encounter for screening for other suspected endocrine disorder: Secondary | ICD-10-CM | POA: Diagnosis not present

## 2024-06-16 DIAGNOSIS — Z1211 Encounter for screening for malignant neoplasm of colon: Secondary | ICD-10-CM

## 2024-06-16 DIAGNOSIS — E785 Hyperlipidemia, unspecified: Secondary | ICD-10-CM

## 2024-06-16 DIAGNOSIS — Z130 Encounter for screening for diseases of the blood and blood-forming organs and certain disorders involving the immune mechanism: Secondary | ICD-10-CM | POA: Diagnosis not present

## 2024-06-16 DIAGNOSIS — B001 Herpesviral vesicular dermatitis: Secondary | ICD-10-CM

## 2024-06-16 DIAGNOSIS — K219 Gastro-esophageal reflux disease without esophagitis: Secondary | ICD-10-CM

## 2024-06-16 DIAGNOSIS — Z13228 Encounter for screening for other metabolic disorders: Secondary | ICD-10-CM

## 2024-06-16 LAB — LIPID PANEL
Cholesterol: 178 mg/dL (ref 0–200)
HDL: 47.1 mg/dL (ref >39.00)
LDL Cholesterol: 107 mg/dL — ABNORMAL HIGH (ref 0–99)
NonHDL: 130.85
Total CHOL/HDL Ratio: 4
Triglycerides: 118 mg/dL (ref 0.0–149.0)
VLDL: 23.6 mg/dL (ref 0.0–40.0)

## 2024-06-16 LAB — COMPREHENSIVE METABOLIC PANEL WITH GFR
ALT: 22 U/L (ref 0–53)
AST: 24 U/L (ref 0–37)
Albumin: 4.5 g/dL (ref 3.5–5.2)
Alkaline Phosphatase: 61 U/L (ref 39–117)
BUN: 16 mg/dL (ref 6–23)
CO2: 28 meq/L (ref 19–32)
Calcium: 9.1 mg/dL (ref 8.4–10.5)
Chloride: 105 meq/L (ref 96–112)
Creatinine, Ser: 1 mg/dL (ref 0.40–1.50)
GFR: 81.97 mL/min (ref >60.00)
Glucose, Bld: 102 mg/dL — ABNORMAL HIGH (ref 70–99)
Potassium: 4.3 meq/L (ref 3.5–5.1)
Sodium: 139 meq/L (ref 135–145)
Total Bilirubin: 0.5 mg/dL (ref 0.2–1.2)
Total Protein: 6.4 g/dL (ref 6.0–8.3)

## 2024-06-16 LAB — HEMOGLOBIN A1C: Hgb A1c MFr Bld: 5.9 % (ref 4.6–6.5)

## 2024-06-16 MED ORDER — VALACYCLOVIR HCL 1 G PO TABS
ORAL_TABLET | ORAL | 0 refills | Status: AC
  Filled 2024-06-16: qty 20, 10d supply, fill #0

## 2024-06-16 MED ORDER — PANTOPRAZOLE SODIUM 20 MG PO TBEC
20.0000 mg | DELAYED_RELEASE_TABLET | Freq: Every day | ORAL | 3 refills | Status: AC
  Filled 2024-06-16: qty 90, 90d supply, fill #0

## 2024-06-16 NOTE — Assessment & Plan Note (Signed)
 Currently on pravastatin  20 mg 1-1/2 tablet daily and low-fat diet. Further recommendations will be given according to lipid panel result.

## 2024-06-16 NOTE — Assessment & Plan Note (Signed)
 Stable for years, not frequent episodes. Continue Valtrex  1 g 2 tablets twice daily x 1 day to take as soon as he starts with an acute exacerbation.

## 2024-06-16 NOTE — Patient Instructions (Addendum)
 A few things to remember from today's visit:  Routine general medical examination at a health care facility  Hyperlipidemia, mild - Plan: Comprehensive metabolic panel with GFR, Lipid panel  Screening for endocrine, metabolic and immunity disorder - Plan: Comprehensive metabolic panel with GFR, Hemoglobin A1c  Colon cancer screening - Plan: Ambulatory referral to Gastroenterology  Gastroesophageal reflux disease without esophagitis - Plan: pantoprazole  (PROTONIX ) 20 MG tablet  If you need refills for medications you take chronically, please call your pharmacy. Do not use My Chart to request refills or for acute issues that need immediate attention. If you send a my chart message, it may take a few days to be addressed, specially if I am not in the office.  Please be sure medication list is accurate. If a new problem present, please set up appointment sooner than planned today.  Health Maintenance, Male Adopting a healthy lifestyle and getting preventive care are important in promoting health and wellness. Ask your health care provider about: The right schedule for you to have regular tests and exams. Things you can do on your own to prevent diseases and keep yourself healthy. What should I know about diet, weight, and exercise? Eat a healthy diet  Eat a diet that includes plenty of vegetables, fruits, low-fat dairy products, and lean protein. Do not eat a lot of foods that are high in solid fats, added sugars, or sodium. Maintain a healthy weight Body mass index (BMI) is a measurement that can be used to identify possible weight problems. It estimates body fat based on height and weight. Your health care provider can help determine your BMI and help you achieve or maintain a healthy weight. Get regular exercise Get regular exercise. This is one of the most important things you can do for your health. Most adults should: Exercise for at least 150 minutes each week. The exercise should  increase your heart rate and make you sweat (moderate-intensity exercise). Do strengthening exercises at least twice a week. This is in addition to the moderate-intensity exercise. Spend less time sitting. Even light physical activity can be beneficial. Watch cholesterol and blood lipids Have your blood tested for lipids and cholesterol at 60 years of age, then have this test every 5 years. You may need to have your cholesterol levels checked more often if: Your lipid or cholesterol levels are high. You are older than 60 years of age. You are at high risk for heart disease. What should I know about cancer screening? Many types of cancers can be detected early and may often be prevented. Depending on your health history and family history, you may need to have cancer screening at various ages. This may include screening for: Colorectal cancer. Prostate cancer. Skin cancer. Lung cancer. What should I know about heart disease, diabetes, and high blood pressure? Blood pressure and heart disease High blood pressure causes heart disease and increases the risk of stroke. This is more likely to develop in people who have high blood pressure readings or are overweight. Talk with your health care provider about your target blood pressure readings. Have your blood pressure checked: Every 3-5 years if you are 9-47 years of age. Every year if you are 17 years old or older. If you are between the ages of 49 and 63 and are a current or former smoker, ask your health care provider if you should have a one-time screening for abdominal aortic aneurysm (AAA). Diabetes Have regular diabetes screenings. This checks your fasting blood sugar level.  Have the screening done: Once every three years after age 63 if you are at a normal weight and have a low risk for diabetes. More often and at a younger age if you are overweight or have a high risk for diabetes. What should I know about preventing  infection? Hepatitis B If you have a higher risk for hepatitis B, you should be screened for this virus. Talk with your health care provider to find out if you are at risk for hepatitis B infection. Hepatitis C Blood testing is recommended for: Everyone born from [birth year] through 1967. Anyone with known risk factors for hepatitis C. Sexually transmitted infections (STIs) You should be screened each year for STIs, including gonorrhea and chlamydia, if: You are sexually active and are younger than 60 years of age. You are older than 60 years of age and your health care provider tells you that you are at risk for this type of infection. Your sexual activity has changed since you were last screened, and you are at increased risk for chlamydia or gonorrhea. Ask your health care provider if you are at risk. Ask your health care provider about whether you are at high risk for HIV. Your health care provider may recommend a prescription medicine to help prevent HIV infection. If you choose to take medicine to prevent HIV, you should first get tested for HIV. You should then be tested every 3 months for as long as you are taking the medicine. Follow these instructions at home: Alcohol use Do not drink alcohol if your health care provider tells you not to drink. If you drink alcohol: Limit how much you have to 0-2 drinks a day. Know how much alcohol is in your drink. In the [place] one drink equals one 12 oz bottle of beer (355 mL), one 5 oz glass of wine (148 mL), or one 1 oz glass of hard liquor (44 mL). Lifestyle Do not use any products that contain nicotine or tobacco. These products include cigarettes, chewing tobacco, and vaping devices, such as e-cigarettes. If you need help quitting, ask your health care provider. Do not use street drugs. Do not share needles. Ask your health care provider for help if you need support or information about quitting drugs. General instructions Schedule regular health,  dental, and eye exams. Stay current with your vaccines. Tell your health care provider if: You often feel depressed. You have ever been abused or do not feel safe at home. Summary Adopting a healthy lifestyle and getting preventive care are important in promoting health and wellness. Follow your health care provider's instructions about healthy diet, exercising, and getting tested or screened for diseases. Follow your health care provider's instructions on monitoring your cholesterol and blood pressure. This information is not intended to replace advice given to you by your health care provider. Make sure you discuss any questions you have with your health care provider. Document Revised: 11/13/2020 Document Reviewed: 11/13/2020 Elsevier Patient Education  2024 [employer].

## 2024-06-16 NOTE — Assessment & Plan Note (Signed)
 We discussed the importance of regular physical activity and healthy diet for prevention of chronic illness and/or complications. Preventive guidelines reviewed. Vaccination: Declines Prevnar 20. He is due for colonoscopy, GI referral placed. Next CPE in a year.

## 2024-06-16 NOTE — Assessment & Plan Note (Signed)
 Problem is well-controlled. Continue pantoprazole  20 mg daily before breakfast as well as GERD precautions.

## 2024-06-16 NOTE — Progress Notes (Signed)
 Chief Complaint  Patient presents with   Annual Exam   Discussed the use of AI scribe software for clinical note transcription with the patient, who gave verbal consent to proceed.  History of Present Illness Gary [name] is a 58 year old male with a PMHx significant for HLD, prediabetes, anxiety, GERD, and prostate cancer s/p proctectomy , who is here today for his routine physical examination.  Last CPE: 06/16/23 He was seen for acute visit on 04/28/2024, when he was complaining of right-sided back/thoracic pain.  He realized since then that pain started after installing a bed headboard, most likely musculoskeletal. Pain has resolved.  BP was also elevated last visit, 160/100 with no history of hypertension.  His blood pressure today normal, it was elevated during recent OV. His weight has not changed significantly.  He exercises regularly, walking up to 20,000 steps on weekdays and 10,000 steps on weekends, averaging 16,400 steps daily. He tracks his activity using his phone and watch.  His diet consists mainly of home-cooked meals, with occasional eating out at lunch.  He avoids excessive fried foods, eats a lot of chicken, and consumes more bread than he should. He does not eat vegetables daily, snacks on popcorn and Cheez-Its.  He does not consume alcohol and quit smoking in 2016.  He sees an eye care provider, dentist, and dermatologist regularly.   He had a colonoscopy in 2020. He has completed his flu and shingles vaccinations.  Immunization History  Administered Date(s) Administered   Influenza Split 03/28/2014   Influenza Whole 04/07/2010   Influenza,inj,Quad PF,6+ Mos 04/15/2024   Influenza-Unspecified 04/11/2015, 02/14/2016, 05/07/2023   PFIZER Comirnaty(Gray Top)Covid-19 Tri-Sucrose Vaccine 05/05/2020   PFIZER(Purple Top)SARS-COV-2 Vaccination 07/08/2019, 08/08/2019, 06/06/2020   Td 07/08/1998, 03/01/2009   Tdap 05/22/2020   Zoster Recombinant(Shingrix) 05/21/2021,  07/16/2021    Health Maintenance  Topic Date Due   Colonoscopy  06/22/2024   COVID-19 Vaccine (5 - 2025-26 season) 07/02/2024 (Originally 03/08/2024)   Pneumococcal Vaccine: 50+ Years (1 of 1 - PCV) 06/16/2025 (Originally 03/26/2014)   HIV Screening  05/21/2029 (Originally [birth year]/1979)   DTaP/Tdap/Td (4 - Td or Tdap) 05/22/2030   Influenza Vaccine  Completed   Hepatitis C Screening  Completed   Zoster Vaccines- Shingrix  Completed   Hepatitis B Vaccines 19-59 Average Risk  Aged Out   HPV VACCINES  Aged Out   Meningococcal B Vaccine  Aged Out   Prostate cancer: S/P prostatectomy.  He follows up with his urologist regularly, with his last appointment in October/2025 showing an undetectable PSA level.  Hyperlipidemia:Currently on pravastatin  20 mg 1.5 tab daily.   Lab Results  Component Value Date   CHOL 180 06/16/2023   HDL 45.60 06/16/2023   LDLCALC 116 (H) 06/16/2023   LDLDIRECT 118.0 09/26/2016   TRIG 94.0 06/16/2023   CHOLHDL 4 06/16/2023   -Hemoglobin A1c has been mildly elevated in the past, no history of diabetes. 06/2023 HgA1C was 6.2.  GERD: Currently on pantoprazole  20 mg daily. He doesn't have any problems when on the medication.   History of recurrent fever blisters, he does not have frequent episodes.  In the past he has been prescribed Valtrex  prn, he needs a refill.  Review of Systems  Constitutional:  Negative for activity change, appetite change and fever.  HENT:  Negative for mouth sores, sore throat and trouble swallowing.   Eyes:  Negative for redness and visual disturbance.  Respiratory:  Negative for cough, shortness of breath and wheezing.  Cardiovascular:  Negative for chest pain, palpitations and leg swelling.  Gastrointestinal:  Negative for abdominal pain, blood in stool, nausea and vomiting.  Endocrine: Negative for cold intolerance, heat intolerance, polydipsia, polyphagia and polyuria.  Genitourinary:  Negative for decreased urine volume,  dysuria and hematuria.  Musculoskeletal:  Negative for gait problem and myalgias.  Skin:  Negative for color change and rash.  Allergic/Immunologic: Positive for environmental allergies.  Neurological:  Negative for syncope, weakness and headaches.  Hematological:  Negative for adenopathy. Does not bruise/bleed easily.  Psychiatric/Behavioral:  Negative for confusion and sleep disturbance.   All other systems reviewed and are negative.  Current Outpatient Medications on File Prior to Visit  Medication Sig Dispense Refill   aspirin 81 MG tablet Take 81 mg by mouth daily.     pravastatin  (PRAVACHOL ) 20 MG tablet Take 1 and 1/2 tablet (30 mg total) by mouth daily. 135 tablet 2   sildenafil  (VIAGRA ) 100 MG tablet Take 1 tablet (100 mg) by mouth once daily as needed for Erectile Dysfunction 15 tablet 11   sildenafil  (VIAGRA ) 100 MG tablet Take 1 tablet (100 mg total) by mouth daily as needed for erectile dysfunction. 15 tablet 11   No current facility-administered medications on file prior to visit.    Past Medical History:  Diagnosis Date   Allergy    Anxiety    GERD (gastroesophageal reflux disease)    Hiatal hernia 2013   egd   Hyperlipidemia     Past Surgical History:  Procedure Laterality Date   MOHS SURGERY  november 2013   melanoma removed from back   rotater cuff repair Right 2020   TONSILLECTOMY     UPPER GASTROINTESTINAL ENDOSCOPY  2013   hh    No Known Allergies  Family History  Problem Relation Age of Onset   Breast cancer Mother    Uterine cancer Mother    Leukemia Father    Arthritis Other    Hypertension Other    Thyroid  disease Other    Uterine cancer Other    Heart disease Other    Crohn's disease Maternal Grandmother    Colon polyps Brother    Colon cancer Other        great maternal grandfather    Colon cancer Other        great uncle    Esophageal cancer Neg Hx    Rectal cancer Neg Hx    Stomach cancer Neg Hx     Social History    Socioeconomic History   Marital status: Married    Spouse name: Not on file   Number of children: 1   Years of education: Not on file   Highest education level: Not on file  Occupational History   Occupation: Civil Engineer, Contracting: UNEMPLOYED  Tobacco Use   Smoking status: Former    Current packs/day: 0.00    Average packs/day: 0.3 packs/day for 20.0 years (5.0 ttl pk-yrs)    Types: Cigarettes    Start date: 71    Quit date: 2016    Years since quitting: 9.9   Smokeless tobacco: Never  Substance and Sexual Activity   Alcohol use: Not Currently    Comment: Drinks a beer once in awhile.  Not often   Drug use: No   Sexual activity: Not on file  Other Topics Concern   Not on file  Social History Narrative   Not on file   Social Drivers of Health   Financial Resource  Strain: Not on file  Food Insecurity: No Food Insecurity (10/14/2022)   Hunger Vital Sign    Worried About Running Out of Food in the Last Year: Never true    Ran Out of Food in the Last Year: Never true  Transportation Needs: No Transportation Needs (10/14/2022)   PRAPARE - Administrator, Civil Service (Medical): No    Lack of Transportation (Non-Medical): No  Physical Activity: Not on file  Stress: Not on file  Social Connections: Not on file   Today's Vitals   06/16/24 0830  BP: 128/80  Pulse: 66  Resp: 16  Temp: 97.9 F (36.6 C)  SpO2: 97%  Weight: 196 lb 9.6 oz (89.2 kg)  Height: 5' 9 (1.753 m)   Body mass index is 29.03 kg/m.  Wt Readings from Last 3 Encounters:  06/16/24 196 lb 9.6 oz (89.2 kg)  04/28/24 195 lb 3.2 oz (88.5 kg)  06/16/23 193 lb 4 oz (87.7 kg)   Physical Exam Vitals and nursing note reviewed.  Constitutional:      General: He is not in acute distress.    Appearance: He is well-developed.  HENT:     Head: Normocephalic and atraumatic.     Right Ear: Tympanic membrane, ear canal and external ear normal.     Left Ear: Tympanic membrane, ear canal and  external ear normal.     Mouth/Throat:     Mouth: Mucous membranes are moist.     Pharynx: Oropharynx is clear. Uvula midline.  Eyes:     Extraocular Movements: Extraocular movements intact.     Conjunctiva/sclera: Conjunctivae normal.     Pupils: Pupils are equal, round, and reactive to light.  Neck:     Thyroid : No thyroid  mass or thyromegaly.     Trachea: No tracheal deviation.  Cardiovascular:     Rate and Rhythm: Normal rate and regular rhythm.     Pulses:          Dorsalis pedis pulses are 2+ on the right side and 2+ on the left side.     Heart sounds: No murmur heard. Pulmonary:     Effort: Pulmonary effort is normal. No respiratory distress.     Breath sounds: Normal breath sounds.  Abdominal:     Palpations: Abdomen is soft. There is no hepatomegaly or mass.     Tenderness: There is no abdominal tenderness.  Genitourinary:    Comments: No concerns. Musculoskeletal:        General: No tenderness.     Cervical back: Normal range of motion.     Right lower leg: No edema.     Left lower leg: No edema.     Comments: No major deformities appreciated and no signs of synovitis.  Lymphadenopathy:     Cervical: No cervical adenopathy.     Upper Body:     Right upper body: No supraclavicular adenopathy.     Left upper body: No supraclavicular adenopathy.  Skin:    General: Skin is warm.     Findings: No erythema.  Neurological:     General: No focal deficit present.     Mental Status: He is alert and oriented to person, place, and time.     Cranial Nerves: No cranial nerve deficit.     Sensory: No sensory deficit.     Motor: No weakness.     Gait: Gait normal.     Deep Tendon Reflexes:     Reflex Scores:  Bicep reflexes are 2+ on the right side and 2+ on the left side.      Patellar reflexes are 2+ on the right side and 2+ on the left side. Psychiatric:        Mood and Affect: Mood and affect normal.    ASSESSMENT AND PLAN:  Mr. Walling was seen today for his  routine annual physical examination.  Orders Placed This Encounter  Procedures   Comprehensive metabolic panel with GFR   Hemoglobin A1c   Lipid panel   Ambulatory referral to Gastroenterology   Lab Results  Component Value Date   HGBA1C 5.9 06/16/2024   Lab Results  Component Value Date   NA 139 06/16/2024   CL 105 06/16/2024   K 4.3 06/16/2024   CO2 28 06/16/2024   BUN 16 06/16/2024   CREATININE 1.00 06/16/2024   GFR 81.97 06/16/2024   CALCIUM 9.1 06/16/2024   ALBUMIN 4.5 06/16/2024   GLUCOSE 102 (H) 06/16/2024   Lab Results  Component Value Date   ALT 22 06/16/2024   AST 24 06/16/2024   ALKPHOS 61 06/16/2024   BILITOT 0.5 06/16/2024   Lab Results  Component Value Date   CHOL 178 06/16/2024   HDL 47.10 06/16/2024   LDLCALC 107 (H) 06/16/2024   LDLDIRECT 118.0 09/26/2016   TRIG 118.0 06/16/2024   CHOLHDL 4 06/16/2024   The 10-year ASCVD risk score (Arnett DK, et al., 2019) is: 8.1%   Values used to calculate the score:     Age: 72 years     Clincally relevant sex: Male     Is Non-Hispanic African American: No     Diabetic: No     Tobacco smoker: No     Systolic Blood Pressure: 128 mmHg     Is BP treated: No     HDL Cholesterol: 47.1 mg/dL     Total Cholesterol: 178 mg/dL  Routine general medical examination at a health care facility Assessment & Plan: We discussed the importance of regular physical activity and healthy diet for prevention of chronic illness and/or complications. Preventive guidelines reviewed. Vaccination: Declines Prevnar 20. He is due for colonoscopy, GI referral placed. Next CPE in a year.   Hyperlipidemia, mild Assessment & Plan: Currently on pravastatin  20 mg 1-1/2 tablet daily and low-fat diet. Further recommendations will be given according to lipid panel result.  Orders: -     Comprehensive metabolic panel with GFR; Future -     Lipid panel; Future  Screening for endocrine, metabolic and immunity disorder -      Comprehensive metabolic panel with GFR; Future -     Hemoglobin A1c; Future  Colon cancer screening -     Ambulatory referral to Gastroenterology  Gastroesophageal reflux disease without esophagitis Assessment & Plan: Problem is well-controlled. Continue pantoprazole  20 mg daily before breakfast as well as GERD precautions.  Orders: -     Pantoprazole  Sodium; Take 1 tablet (20 mg total) by mouth daily.  Dispense: 90 tablet; Refill: 3  Recurrent herpes labialis Assessment & Plan: Stable for years, not frequent episodes. Continue Valtrex  1 g 2 tablets twice daily x 1 day to take as soon as he starts with an acute exacerbation.  Orders: -     valACYclovir  HCl; Take 2 tablets by mouth twice daily for 1 day as needed for acute flare ups.  Dispense: 20 tablet; Refill: 0  Return in 1 year (on 06/16/2025) for CPE.  Shavaun Osterloh G. Jasmia Angst, MD  [facility]. [place]  office.

## 2024-06-30 ENCOUNTER — Other Ambulatory Visit (HOSPITAL_COMMUNITY): Payer: Self-pay

## 2024-06-30 ENCOUNTER — Other Ambulatory Visit: Payer: Self-pay

## 2024-07-19 ENCOUNTER — Encounter: Payer: Self-pay | Admitting: Internal Medicine

## 2024-07-19 ENCOUNTER — Other Ambulatory Visit: Payer: Self-pay

## 2024-08-06 ENCOUNTER — Encounter: Payer: Self-pay | Admitting: Internal Medicine

## 2024-08-06 ENCOUNTER — Ambulatory Visit

## 2024-08-06 ENCOUNTER — Other Ambulatory Visit (HOSPITAL_COMMUNITY): Payer: Self-pay

## 2024-08-06 VITALS — Ht 69.0 in | Wt 194.0 lb

## 2024-08-06 DIAGNOSIS — Z860100 Personal history of colon polyps, unspecified: Secondary | ICD-10-CM

## 2024-08-06 MED ORDER — NA SULFATE-K SULFATE-MG SULF 17.5-3.13-1.6 GM/177ML PO SOLN
1.0000 | Freq: Once | ORAL | 0 refills | Status: AC
  Filled 2024-08-06: qty 354, 1d supply, fill #0

## 2024-08-06 NOTE — Progress Notes (Signed)
 Pre visit completed in person; Patient verified name, DOB, and address; No egg or soy allergy known to patient;  No issues known to pt with past sedation with any surgeries or procedures; Patient denies ever being told they had issues or difficulty with intubation;  No FH of Malignant Hyperthermia; Pt is not on diet pills; Pt is not on home 02;  Pt is not on blood thinners;  Pt denies issues with constipation;  No A fib or A flutter; Have any cardiac testing pending--NO Insurance verified during PV appt--- [employer] Pt can ambulate without assistance;  Pt denies use of chewing tobacco; Discussed diabetic/weight loss medication holds; Discussed NSAID holds; Checked BMI to be less than 50; Pt instructed to use Singlecare.com or GoodRx for a price reduction on prep;  Patient's chart reviewed by Norleen Schillings CNRA prior to previsit and patient appropriate for the [facility];  Pre visit completed and red dot placed by patient's name on their procedure day (on provider's schedule);   Instructions sent to MyChart as well as printed and given to patient at time of PV appt;

## 2024-08-11 ENCOUNTER — Other Ambulatory Visit (HOSPITAL_COMMUNITY): Payer: Self-pay

## 2024-08-20 ENCOUNTER — Encounter: Admitting: Internal Medicine
